# Patient Record
Sex: Male | Born: 1945 | Race: Black or African American | Hispanic: No | Smoking: Former smoker
Health system: Southern US, Community
[De-identification: ages and names within clinical notes are randomized; demographics above are authoritative.]

## PROBLEM LIST (undated history)

## (undated) DIAGNOSIS — I639 Cerebral infarction, unspecified: Secondary | ICD-10-CM

## (undated) DIAGNOSIS — F0390 Unspecified dementia without behavioral disturbance: Secondary | ICD-10-CM

## (undated) DIAGNOSIS — I10 Essential (primary) hypertension: Secondary | ICD-10-CM

---

## 2005-03-09 ENCOUNTER — Emergency Department: Payer: Self-pay | Admitting: Emergency Medicine

## 2005-05-06 ENCOUNTER — Other Ambulatory Visit: Payer: Self-pay

## 2005-05-06 ENCOUNTER — Inpatient Hospital Stay: Payer: Self-pay | Admitting: Internal Medicine

## 2005-05-09 ENCOUNTER — Emergency Department: Payer: Self-pay | Admitting: General Practice

## 2005-05-17 ENCOUNTER — Ambulatory Visit: Payer: Self-pay | Admitting: *Deleted

## 2006-02-19 ENCOUNTER — Emergency Department: Payer: Self-pay | Admitting: Emergency Medicine

## 2006-08-03 ENCOUNTER — Ambulatory Visit: Payer: Self-pay | Admitting: Internal Medicine

## 2006-08-03 ENCOUNTER — Other Ambulatory Visit: Payer: Self-pay

## 2007-05-21 ENCOUNTER — Other Ambulatory Visit: Payer: Self-pay

## 2007-05-21 ENCOUNTER — Emergency Department: Payer: Self-pay | Admitting: Emergency Medicine

## 2007-07-24 ENCOUNTER — Ambulatory Visit: Payer: Self-pay | Admitting: Gastroenterology

## 2007-07-26 ENCOUNTER — Ambulatory Visit: Payer: Self-pay | Admitting: Gastroenterology

## 2008-08-14 ENCOUNTER — Ambulatory Visit: Payer: Self-pay | Admitting: Internal Medicine

## 2010-03-16 ENCOUNTER — Inpatient Hospital Stay: Payer: Self-pay | Admitting: Internal Medicine

## 2011-04-24 ENCOUNTER — Emergency Department: Payer: Self-pay | Admitting: Emergency Medicine

## 2011-04-24 LAB — CBC
HCT: 44.5 % (ref 40.0–52.0)
HGB: 15.3 g/dL (ref 13.0–18.0)
MCH: 28.1 pg (ref 26.0–34.0)
MCHC: 34.4 g/dL (ref 32.0–36.0)
MCV: 82 fL (ref 80–100)
RBC: 5.44 10*6/uL (ref 4.40–5.90)
RDW: 13.4 % (ref 11.5–14.5)
WBC: 7.5 10*3/uL (ref 3.8–10.6)

## 2011-04-24 LAB — PROTIME-INR: Prothrombin Time: 15 secs — ABNORMAL HIGH (ref 11.5–14.7)

## 2011-04-24 LAB — TROPONIN I: Troponin-I: 0.11 ng/mL — ABNORMAL HIGH

## 2011-04-24 LAB — COMPREHENSIVE METABOLIC PANEL
Albumin: 4 g/dL (ref 3.4–5.0)
Alkaline Phosphatase: 72 U/L (ref 50–136)
Bilirubin,Total: 0.4 mg/dL (ref 0.2–1.0)
Calcium, Total: 9.6 mg/dL (ref 8.5–10.1)
Glucose: 95 mg/dL (ref 65–99)
Osmolality: 281 (ref 275–301)
SGOT(AST): 44 U/L — ABNORMAL HIGH (ref 15–37)
Sodium: 140 mmol/L (ref 136–145)

## 2011-04-26 ENCOUNTER — Inpatient Hospital Stay: Payer: Self-pay | Admitting: Internal Medicine

## 2011-04-26 LAB — CBC
HCT: 45.2 % (ref 40.0–52.0)
HGB: 14.8 g/dL (ref 13.0–18.0)
MCH: 27.5 pg (ref 26.0–34.0)
MCHC: 32.7 g/dL (ref 32.0–36.0)
Platelet: 236 10*3/uL (ref 150–440)
RDW: 14.6 % — ABNORMAL HIGH (ref 11.5–14.5)

## 2011-04-26 LAB — COMPREHENSIVE METABOLIC PANEL
Albumin: 4 g/dL (ref 3.4–5.0)
Alkaline Phosphatase: 77 U/L (ref 50–136)
Bilirubin,Total: 0.4 mg/dL (ref 0.2–1.0)
Chloride: 103 mmol/L (ref 98–107)
Creatinine: 1.26 mg/dL (ref 0.60–1.30)
Glucose: 100 mg/dL — ABNORMAL HIGH (ref 65–99)
Potassium: 3.1 mmol/L — ABNORMAL LOW (ref 3.5–5.1)
SGOT(AST): 43 U/L — ABNORMAL HIGH (ref 15–37)
SGPT (ALT): 15 U/L
Sodium: 144 mmol/L (ref 136–145)
Total Protein: 8.7 g/dL — ABNORMAL HIGH (ref 6.4–8.2)

## 2011-04-26 LAB — URINALYSIS, COMPLETE
Bilirubin,UR: NEGATIVE
Glucose,UR: NEGATIVE mg/dL (ref 0–75)
Ketone: NEGATIVE
Leukocyte Esterase: NEGATIVE
Ph: 6 (ref 4.5–8.0)
RBC,UR: NONE SEEN /HPF (ref 0–5)
Squamous Epithelial: <1
WBC UR: <1 /HPF (ref 0–5)

## 2011-04-26 LAB — CK TOTAL AND CKMB (NOT AT ARMC)
CK, Total: 47 U/L (ref 35–232)
CK-MB: <0.5 ng/mL — ABNORMAL LOW (ref 0.5–3.6)
CK-MB: <0.5 ng/mL — ABNORMAL LOW (ref 0.5–3.6)

## 2011-04-26 LAB — TROPONIN I: Troponin-I: 0.07 ng/mL — ABNORMAL HIGH

## 2011-04-29 LAB — LIPID PANEL
Cholesterol: 147 mg/dL (ref 0–200)
HDL Cholesterol: 52 mg/dL (ref 40–60)
Ldl Cholesterol, Calc: 79 mg/dL (ref 0–100)
Triglycerides: 81 mg/dL (ref 0–200)
VLDL Cholesterol, Calc: 16 mg/dL (ref 5–40)

## 2011-04-29 LAB — CBC WITH DIFFERENTIAL/PLATELET
Basophil %: 0.4 %
Eosinophil #: 0.2 10*3/uL (ref 0.0–0.7)
Eosinophil %: 2.9 %
HCT: 39 % — ABNORMAL LOW (ref 40.0–52.0)
HGB: 12.9 g/dL — ABNORMAL LOW (ref 13.0–18.0)
Lymphocyte #: 2.3 10*3/uL (ref 1.0–3.6)
Lymphocyte %: 35.2 %
MCH: 27.5 pg (ref 26.0–34.0)
MCHC: 33.1 g/dL (ref 32.0–36.0)
MCV: 83 fL (ref 80–100)
Monocyte #: 0.6 10*3/uL (ref 0.0–0.7)
Neutrophil #: 3.5 10*3/uL (ref 1.4–6.5)
Neutrophil %: 52.7 %
RBC: 4.69 10*6/uL (ref 4.40–5.90)

## 2011-04-29 LAB — BASIC METABOLIC PANEL
Calcium, Total: 9.5 mg/dL (ref 8.5–10.1)
Co2: 26 mmol/L (ref 21–32)
Creatinine: 1.3 mg/dL (ref 0.60–1.30)
EGFR (African American): >60
Potassium: 3.8 mmol/L (ref 3.5–5.1)
Sodium: 139 mmol/L (ref 136–145)

## 2011-04-30 ENCOUNTER — Ambulatory Visit: Payer: Self-pay | Admitting: Internal Medicine

## 2011-05-02 LAB — WOUND CULTURE

## 2011-05-03 LAB — CBC WITH DIFFERENTIAL/PLATELET
Basophil #: 0.1 10*3/uL (ref 0.0–0.1)
Basophil %: 0.9 %
Eosinophil #: 0.4 10*3/uL (ref 0.0–0.7)
HGB: 12.4 g/dL — ABNORMAL LOW (ref 13.0–18.0)
Lymphocyte %: 30.2 %
MCH: 27.5 pg (ref 26.0–34.0)
Monocyte #: 0.5 10*3/uL (ref 0.0–0.7)
Monocyte %: 8.2 %
Platelet: 249 10*3/uL (ref 150–440)
RDW: 13.9 % (ref 11.5–14.5)
WBC: 6.5 10*3/uL (ref 3.8–10.6)

## 2011-05-03 LAB — BASIC METABOLIC PANEL
Anion Gap: 11 (ref 7–16)
BUN: 23 mg/dL — ABNORMAL HIGH (ref 7–18)
Chloride: 105 mmol/L (ref 98–107)
Creatinine: 1.05 mg/dL (ref 0.60–1.30)
EGFR (Non-African Amer.): >60
Glucose: 86 mg/dL (ref 65–99)
Osmolality: 288 (ref 275–301)
Potassium: 4 mmol/L (ref 3.5–5.1)

## 2011-05-03 LAB — APTT: Activated PTT: 139.4 secs — ABNORMAL HIGH (ref 23.6–35.9)

## 2011-05-04 LAB — APTT: Activated PTT: 110.7 secs — ABNORMAL HIGH (ref 23.6–35.9)

## 2011-05-05 LAB — APTT: Activated PTT: 102.3 secs — ABNORMAL HIGH (ref 23.6–35.9)

## 2011-05-05 LAB — PROTIME-INR: Prothrombin Time: 18.4 secs — ABNORMAL HIGH (ref 11.5–14.7)

## 2011-05-05 LAB — PLATELET COUNT: Platelet: 263 10*3/uL (ref 150–440)

## 2011-05-06 LAB — APTT: Activated PTT: 124.4 secs — ABNORMAL HIGH (ref 23.6–35.9)

## 2011-05-07 LAB — BASIC METABOLIC PANEL
Anion Gap: 8 (ref 7–16)
BUN: 17 mg/dL (ref 7–18)
Calcium, Total: 9 mg/dL (ref 8.5–10.1)
Co2: 29 mmol/L (ref 21–32)
EGFR (African American): >60
EGFR (Non-African Amer.): >60
Glucose: 97 mg/dL (ref 65–99)
Osmolality: 286 (ref 275–301)
Sodium: 143 mmol/L (ref 136–145)

## 2011-05-07 LAB — PROTIME-INR
INR: 2.2
Prothrombin Time: 24.4 secs — ABNORMAL HIGH (ref 11.5–14.7)

## 2011-05-08 LAB — PROTIME-INR
INR: 2.4
Prothrombin Time: 26.7 secs — ABNORMAL HIGH (ref 11.5–14.7)

## 2011-05-09 LAB — PROTIME-INR
INR: 2.7
Prothrombin Time: 28.7 secs — ABNORMAL HIGH (ref 11.5–14.7)

## 2011-05-11 LAB — PROTIME-INR: INR: 3

## 2011-05-12 LAB — CBC WITH DIFFERENTIAL/PLATELET
Basophil #: 0 10*3/uL (ref 0.0–0.1)
Basophil %: 0.8 %
Eosinophil %: 4.7 %
HCT: 39 % — ABNORMAL LOW (ref 40.0–52.0)
Lymphocyte #: 1.8 10*3/uL (ref 1.0–3.6)
Lymphocyte %: 33.7 %
MCV: 83 fL (ref 80–100)
Monocyte %: 6.5 %
Neutrophil #: 3 10*3/uL (ref 1.4–6.5)
RDW: 14.4 % (ref 11.5–14.5)
WBC: 5.5 10*3/uL (ref 3.8–10.6)

## 2011-05-12 LAB — BASIC METABOLIC PANEL
Anion Gap: 10 (ref 7–16)
BUN: 20 mg/dL — ABNORMAL HIGH (ref 7–18)
Chloride: 106 mmol/L (ref 98–107)
Creatinine: 0.82 mg/dL (ref 0.60–1.30)
Osmolality: 285 (ref 275–301)
Potassium: 4.4 mmol/L (ref 3.5–5.1)

## 2011-05-12 LAB — PROTIME-INR
INR: 3.5
Prothrombin Time: 35 secs — ABNORMAL HIGH (ref 11.5–14.7)

## 2011-05-13 LAB — PROTIME-INR
INR: 3.3
Prothrombin Time: 33.4 s — ABNORMAL HIGH

## 2011-05-28 ENCOUNTER — Ambulatory Visit: Payer: Self-pay | Admitting: Internal Medicine

## 2014-07-21 NOTE — Consult Note (Signed)
PATIENT NAME:  Willie George, Willie George MR#:  [id number] DATE OF BIRTH:  Oct 16, 1945  DATE OF CONSULTATION:  04/26/2011  REFERRING PHYSICIAN:  Dr. Allena KatzPatel CONSULTING PHYSICIAN:  Cammy CopaPaul H. Alessander Sikorski, MD  REASON FOR CONSULTATION: Acute vertigo.   HISTORY OF PRESENT ILLNESS: The patient a 69 year old African American male who complains of getting up out of bed and was acutely dizzy starting Saturday morning. He said he fell. He has continued to remain unsteady of his gait and dizzy and not able to walk. He feels like the room is spinning. He has not had any pain his ears, no drainage, no signs of infection from his ears, and no upper respiratory infection. He has not had any change in his hearing at all or feeling any fullness in his ears. He denies having similar dizzy symptoms in the past, although there is a note in the chart that he has had similar dizziness in December 2011 with a MRI of the brain and carotid duplex, all being negative. He has been taking some meclizine on a regular basis because of the dizziness, using it three times daily currently.  PAST MEDICAL HISTORY: History is significant for coronary artery disease. He had enzymes drawn make sure there is no cardiac problems. He has had hypertension that has been mildly elevated. He has had high cholesterol, on medication.   CURRENT MEDICATIONS: Pravastatin, chlorthalidone, clonidine, aspirin, and meclizine.   PHYSICAL EXAMINATION:   GENERAL: The patient is awake and alert. He has some difficulty getting some words out and it is not a stuttering but more a stammering. He does eventually get it out. His mentation appears to be intact. He seems to be a fairly good historian. His brother is with him who seems to know his history and help out.  EYES: No nystagmus currently.  EARS: His ear canals are clear. TMs are intact, benign. The middle ear space looks normal on both sides.   NOSE: Open and clear.   MOUTH: Oropharynx shows him to have dentures in  place. Posterior pharynx is clear.   NECK: Neck is negative for any nodes or masses. No tenderness here. Neck is supple and moves well.   IMPRESSION: He has had an acute episode of vertigo again. He is scheduled for a MRI scan to make sure there are no significant changes here. CT scan done acutely two days ago showed no evidence of acute intracranial bleed or stroke. The patient's history is consistent with an acute labyrinthitis or even potentially Meniere's disease if he has recurring flare-ups of this. ENG needs to be done, and once the patient is discharged from the hospital he has to be off meclizine for three days prior to that. Right now he is eating and feeling better, and I think he can have the meclizine put on a p.r.n. basis rather than on a scheduled basis. He will need to be off it then when he is at home so we can schedule the balance test to be done. We will get that arranged once he leaves to follow up with us and see if there is significant weakness on one side.  ____________________________ Cammy CopaPaul H. Daralyn Bert, MD phj:slb D: 04/26/2011 19:00:00 ET T: 04/27/2011 09:04:35 ET JOB#: [id number]  cc: Cammy CopaPaul H. Nayanna Seaborn, MD, <Dictator> Cammy CopaPAUL H Gerber Penza MD ELECTRONICALLY SIGNED 04/29/2011 8:27

## 2014-07-21 NOTE — Discharge Summary (Signed)
PATIENT NAME:  Willie George, Willie George MR#:  [id number] DATE OF BIRTH:  November 22, 1945  DATE OF ADMISSION:  04/26/2011 DATE OF DISCHARGE:  05/13/2011  This is the final discharge summary in addition to the interim discharge summary dictated by Dr. Camillo FlamingKamran Lateef on 05/11/2011. My summary will include hospital stay 02/13 and 05/13/2011.    HOSPITAL COURSE: [name] continued to improve as far as his left cerebellar and pontine infarct. His INR was therapeutic on warfarin. His dizziness had improved. He will continue physical therapy. He will be going to rehab [facility]. Hospital stay was prolonged, however, remained stable.   For detailed discharge summary per Dr. Garnett FarmLateef's interim discharge summary.   FINAL MEDICATION LIST:  1. Clonidine 0.2 mg b.i.d.  2. Pravachol 20 mg at bedtime.  3. Tylenol 650 mg p.o. every four hours p.r.n.  4. Zofran 4 mg every six hours p.r.n.  5. Valium 5 mg p.o. at bedtime.   6. Meclizine 25 mg b.i.d. p.r.n. for dizziness.  7. Amlodipine 5 mg daily.  8. Warfarin 4 mg q.11:00 a.m.  9. Celexa 20 mg daily.  10. Metoprolol 25 mg b.i.d.   INSTRUCTIONS:  1. Physical therapy.  2. Speech therapy to follow.  3. Aspiration precautions.  4. Check PT/INR on 05/15/2011 and adjust Coumadin dose accordingly.   CODE STATUS: Patient is a NO CODE, DO NOT RESUSCITATE.     TIME SPENT: 30 minutes.   ____________________________ Wylie HailSona A. Allena KatzPatel, MD sap:cms D: 05/13/2011 13:58:27 ET T: 05/13/2011 14:19:05 ET JOB#: [id number]  cc: Hawk Mones A. Allena KatzPatel, MD, <Dictator> Willow OraSONA A Helyne Genther MD ELECTRONICALLY SIGNED 05/23/2011 11:35

## 2014-07-21 NOTE — Consult Note (Signed)
PATIENT NAME:  Willie George, [name]:  [id number] DATE OF BIRTH:  1945/10/17  DATE OF CONSULTATION:  04/26/2011  REFERRING PHYSICIAN:  Dr. Allena KatzPatel  CONSULTING PHYSICIAN:  Rose PhiPeter R. Kemper Durielarke, MD  HISTORY: Mr. Willie George is a 69 year old left-handed widowed African American patient of [facility], retired Education officer, environmentalmill worker at the [employer] with history of hypertension and remote tobacco and ethanol abuse. He was admitted early 04/26/2011 and is referred for evaluation of transient ischemic attack. History comes from the patient and from his hospital records.   The patient came to the Emergency Room shortly after midnight on 04/26/2011 secondary to continued problems with poor balance, dizziness, unsteadiness on his feet. He had been seen in the Emergency Room on Saturday, 04/24/2011, and was prescribed meclizine for dizziness beginning the same day. Reports today being unsteady with need to cruise on getting out of bed the morning of 04/24/2011, and then falling loss of balance when he tried to put on his pants. He denies recent cold or flu or sinus symptoms. He reports also some problem with right side right eye vision beginning the morning of 01/26. Denies any prior similar problems. Brain CT scan was benign; he is scheduled for brain MRI scan to be performed morning of the 29th. He was seen by Dr. Vernie MurdersPaul Juengel of ear, nose, and throat with normal examination and question of acute labyrinthitis.   PHYSICAL EXAMINATION: The patient is a well-developed, well-nourished African American gentleman who was pleasant and cooperative in no apparent distress, examined lying semisupine. He was normocephalic without evidence of trauma. His neck was supple and his ears were clear. He had very mild hesitation of speech with normal expression and normal mental status overall, although cognitive testing was not performed in detail. He was alert and oriented and was lucid and a good historian with normal affect. Cranial nerve examination was  notable for lateral gaze-paretic nystagmus with looking to his right side. Visual fields were full to finger count for each eye. There is no facial weakness and hearing was within normal limits to finger rubbing bilaterally. On motor examination of the extremities, there was normal tone and muscle bulk throughout with full power in the arms and legs proximally and distally. Extremity sensation was symmetric and normal. On coordination examination, there was mild left finger-to-nose dystaxia with good bilateral hand and foot tapping. His gait was not tested. Reflexes were symmetric and rated 1+ throughout.   IMPRESSION: His clinical picture appears most consistent with recent small posterior circulation nonhemorrhagic stroke of the brainstem, I suspect of the left cerebellum or pons.   RECOMMENDATIONS:  1. I agree with his present work-up and treatment in hospital including imaging and laboratory studies.  2. Doppler of the carotid and vertebral arteries.  3. One small aspirin per day.  4. Physical therapy, occupational therapy referrals.   I appreciate being asked to see this pleasant and interesting gentleman. I will plan to follow up on the result of his brain MRI scan tomorrow.   ____________________________ Rose PhiPeter R. Kemper Durielarke, MD prc:cms D: 04/27/2011 11:08:00 ET T: 04/27/2011 11:38:34 ET JOB#: [id number]  cc: Rose PhiPeter R. Kemper Durielarke, MD, <Dictator> Gaspar GarbePETER R Kamarion Zagami MD ELECTRONICALLY SIGNED 04/30/2011 14:41

## 2014-07-21 NOTE — Consult Note (Signed)
Willie George [name] Willie George George, Willie George Willie George George MR#:  [id number] DATE OF BIRTH:  [birth year]  DATE OF CONSULTATION:  04/27/2011  CONSULTING PHYSICIAN:  Claude Manges, MD  REASON FOR CONSULTATION: Buttock abscess.   HISTORY OF PRESENT ILLNESS: Willie George Willie George George is a 69 year old black male who was admitted yesterday with dizziness and a headache and found to have a small left cerebellar CVA. While his other medical problems are being worked up, he was noted to have some drainage from his buttock and, therefore, I was consulted.   PAST MEDICAL HISTORY:  1. Hypertension, previously out of control.  2. Tobacco use.  3. Anxiety.  4. Hypokalemia.  5. Memory deficit.   ALLERGIES: No known drug allergies.   MEDICATIONS ON ADMISSION: Chlorthalidone, clonidine, meclizine, metoprolol and pravastatin.   SOCIAL HISTORY: Willie George Willie George George recently quit smoking and denies alcohol use and illicit drug use.   FAMILY HISTORY: Noncontributory.   REVIEW OF SYSTEMS: Review of systems is negative for 10 systems except as mentioned in Willie George history of present illness. Specifically, Willie George Willie George George has memory deficit and cannot remember whether he had previous surgery or not. He does know that he is hospitalized for "another problem" than Willie George drainage from near his buttocks.   PHYSICAL EXAMINATION:  GENERAL: Examination reveals a comfortable sleeping elderly black gentleman who arouses easily and is quite cooperative. Height is 5 feet 5 inches, weight 139 pounds, BMI 23.2.   VITAL SIGNS: Temperature 97.4 (he has been afebrile throughout this admission), pulse 80, respirations 18, blood pressure 130/84.   HEENT: Pupils are equally round and reactive to light. Extraocular movements are intact. Sclerae are anicteric. Oropharynx is clear.   NECK: Supple with no lymphadenopathy or jugular venous distention. Willie George trachea is in Willie George midline.   HEART: Regular rate and rhythm with no murmurs or rubs.   LUNGS: Clear to auscultation with normal  respiratory effort bilaterally.   ABDOMEN: Soft, nontender, nondistended with no palpable hepatosplenomegaly or other masses.   BACK/ANAL: Exam reveals a previous scar just to Willie George left at Willie George natal cleft with an area of induration and drainage consistent with a recurrent pilonidal sinus tract that is currently infected and has a little bit of surrounding cellulitis.   EXTREMITIES: No edema with normal capillary refill bilaterally.   NEUROLOGIC: Cranial nerves II through XII, motor and sensation grossly intact.   PSYCHIATRIC: Arousable, but with clear memory deficit regarding his past medical history and even parts of his current hospitalization.   LABORATORY, DIAGNOSTIC AND RADIOLOGICAL DATA:  Electrolytes are normal with Willie George exception of a potassium of 3.1.  Hepatic profile is normal.  White blood cell count 9.8, hematocrit 45%, PT 15, INR 1.1. A wound culture of Willie George pilonidal cyst/sinus tract showed a few gram-positive cocci in pairs with rare gram-negative rods and no white blood cells.  Urinalysis was clear, and an ultrasound of Willie George soft tissue surrounding Willie George pilonidal area showed an ill-defined hypoechoic collection that measured 4 x 1 x 2 cm.   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   PLAN: I will place Willie George Willie George George on oral antibiotics that he can go home on if he is discharged from Willie George standpoint of his recent cerebrovascular accident. I will be happy to follow him up in my office in a couple of weeks.   ____________________________ Claude Manges, MD wfm:cbb D: 04/27/2011 17:23:00 ET T: 04/27/2011 18:36:45 ET JOB#: [id number]  cc: Claude Manges, MD, <  Dictator> Claude MangesWILLIAM F Leylany Nored MD ELECTRONICALLY SIGNED 04/29/2011 12:26

## 2014-07-21 NOTE — H&P (Signed)
PATIENT NAME:  Willie George, Willie George MR#:  [id number] DATE OF BIRTH:  June 05, 1945  DATE OF ADMISSION:  04/26/2011  PRIMARY CARE PHYSICIAN:  Willie George  CHIEF COMPLAINT: Dizziness, headache.    HISTORY OF PRESENT ILLNESS: The patient is a 69 year old male who presents with chief complaint of dizziness. Symptoms started yesterday. The patient felt that the room was spinning around him. He had a fall last night. He denies any significant injuries. Blood pressure has been elevated at home. He was recently hospitalized for hypertensive urgency on 03/17/2011 and was treated with IV labetalol. He has been maintained on clonidine for high blood pressure. In the Emergency Room today he underwent CT of the brain, which is negative. He was evaluated in the Emergency Room yesterday 04/24/2011 and his troponin was noted to be 0.1. Troponin today is 0.08. Troponin on 04/24/2011 was 0.11. He was evaluated in the Emergency Room on 03/18/2011 for chest pain, medical management was recommended with aspirin and beta blockers.   PAST MEDICAL HISTORY:  1. Hypertensive urgency. 2. Tobacco abuse. 3. Anxiety. 4. Hypokalemia.   ALLERGIES: No known drug allergies.   CURRENT MEDICATIONS:  1. Chlorthalidone 25 mg p.o. daily.  2. Clonidine 0.2 mg p.o. b.i.d.  3. Meclizine 25 mg p.o. t.i.d.  4. Metoprolol 75 mg p.o. b.i.d.  5. Pravastatin 20 mg p.o. daily.   SOCIAL HISTORY: He reports history of tobacco abuse. He quit smoking recently. He denies alcohol abuse or drug abuse.   FAMILY HISTORY: Denies coronary artery disease, diabetes, or stroke.     REVIEW OF SYSTEMS: CONSTITUTIONAL: The patient denies any fevers, chills, or night sweats.  HEENT: The patient denies any hearing loss, dysphagia, visual problems, sore throat.  CARDIOVASCULAR: The patient denies any chest pain, orthopnea, or paroxysmal nocturnal dyspnea. RESPIRATORY: The patient denies any cough, wheezing, or hemoptysis. GI: The patient denies any nausea,  vomiting, abdominal pain, hematemesis, hematochezia, or melena. GU: The patient denies any hematuria, dysuria, or frequency.  NEURO:  The patient denies any headache, focal weakness, or seizures. SKIN: The patient denies any lesions or rash. ENDOCRINE: The patient denies polyuria, polyphagia, or polydipsia. MUSCULOSKELETAL: The patient denies any arthralgias, myalgias, joint swelling, or tenderness. HEMATOLOGIC: The patient denies any easy bleeding or bruises.   PHYSICAL EXAMINATION:  VITAL SIGNS: Temperature 98, heart rate 67, respiratory rate 18, blood pressure 157/97. O2 sats 100%.   HEENT: Atraumatic, normocephalic. Pupils equal, round, reactive to light and accommodation.  Extraocular movements intact.  Sclerae anicteric. Mucous membranes are moist.   NECK: Supple. No organomegaly.   CARDIOVASCULAR: S1, S2. Regular rate, rhythm. No gallops. No thrills. No murmurs.   LUNGS: Clear to auscultation. No rales, no rhonchi, no wheezes, no bronchial breath sounds.   GI: Abdomen is soft, nontender, nondistended. Normal bowel sounds. No hepatosplenomegaly.   GU: There is no hematuria or masses noted.   SKIN: No lesions, no rash.  ENDOCRINE:  No masses, no thyromegaly.  LYMPH:  No lymphadenopathy or nodes palpable.   NEUROLOGICAL: Cranial nerves II-XII grossly intact. Motor strength is 5/5 bilateral upper and lower extremities. Sensation is within normal limits. No focal neurological deficit noted on examination.   MUSCULOSKELETAL: No arthritis, joint effusion, or swelling.   EXTREMITIES: No cyanosis, no clubbing, no edema. 2+ pedal pulses are noted bilaterally.  ELECTROCARDIOGRAM: Sinus rhythm with premature atrial complexes, 68 beats per minute, left ventricular hypertrophy. CT of the brain is negative.   ASSESSMENT AND PLAN: 1. The patient is a 69 year old male who  presents with chief complaint of dizziness and elevated troponin. We will admit to telemetry. Start aspirin. Continue  metoprolol. Check serial cardiac enzymes, troponin, and echo. Cardiology consultation.  2. Transient ischemic attack.  Check MRI, MRA of brain. Neurology consultation.  3. Hypertension. Continue clonidine.  4. Hyperlipidemia. Continue pravastatin.  5. Hypokalemia. Replace potassium, recheck in the morning.  6. Elevated AST. Monitor liver functions.   ____________________________ Willie AstJignesh S. Saraann Enneking, MD jsp:bjt D: 04/26/2011 03:14:29 ET T: 04/26/2011 07:36:20 ET JOB#: [id number]  cc: Willie AstJignesh S. Draco Malczewski, MD, <Dictator> Willie George Willie AstJIGNESH S Modestine Scherzinger MD ELECTRONICALLY SIGNED 04/26/2011 21:47

## 2014-07-21 NOTE — H&P (Signed)
PATIENT NAME:  Willie George, Willie George MR#:  [id number] DATE OF BIRTH:  06/28/1945  DATE OF ADMISSION:  04/26/2011  PRIMARY CARE PHYSICIAN:  [facility]  CHIEF COMPLAINT: Dizziness, headache.    HISTORY OF PRESENT ILLNESS: The patient is a 69-year-old male who presents with chief complaint of dizziness. Symptoms started yesterday. The patient felt that the room was spinning around him. He had a fall last night. He denies any significant injuries. Blood pressure has been elevated at home. He was recently hospitalized for hypertensive urgency on 03/17/2011 and was treated with IV labetalol. He has been maintained on clonidine for high blood pressure. In the Emergency Room today he underwent CT of the brain, which is negative. He was evaluated in the Emergency Room yesterday 04/24/2011 and his troponin was noted to be 0.1. Troponin today is 0.08. Troponin on 04/24/2011 was 0.11. He was evaluated in the Emergency Room on 03/18/2011 for chest pain, medical management was recommended with aspirin and beta blockers.   PAST MEDICAL HISTORY:  1. Hypertensive urgency. 2. Tobacco abuse. 3. Anxiety. 4. Hypokalemia.   ALLERGIES: No known drug allergies.   CURRENT MEDICATIONS:  1. Chlorthalidone 25 mg p.o. daily.  2. Clonidine 0.2 mg p.o. b.i.d.  3. Meclizine 25 mg p.o. t.i.d.  4. Metoprolol 75 mg p.o. b.i.d.  5. Pravastatin 20 mg p.o. daily.   SOCIAL HISTORY: He reports history of tobacco abuse. He quit smoking recently. He denies alcohol abuse or drug abuse.   FAMILY HISTORY: Denies coronary artery disease, diabetes, or stroke.     REVIEW OF SYSTEMS: CONSTITUTIONAL: The patient denies any fevers, chills, or night sweats.  HEENT: The patient denies any hearing loss, dysphagia, visual problems, sore throat.  CARDIOVASCULAR: The patient denies any chest pain, orthopnea, or paroxysmal nocturnal dyspnea. RESPIRATORY: The patient denies any cough, wheezing, or hemoptysis. GI: The patient denies any nausea,  vomiting, abdominal pain, hematemesis, hematochezia, or melena. GU: The patient denies any hematuria, dysuria, or frequency.  NEURO:  The patient denies any headache, focal weakness, or seizures. SKIN: The patient denies any lesions or rash. ENDOCRINE: The patient denies polyuria, polyphagia, or polydipsia. MUSCULOSKELETAL: The patient denies any arthralgias, myalgias, joint swelling, or tenderness. HEMATOLOGIC: The patient denies any easy bleeding or bruises.   PHYSICAL EXAMINATION:  VITAL SIGNS: Temperature 98, heart rate 67, respiratory rate 18, blood pressure 157/97. O2 sats 100%.   HEENT: Atraumatic, normocephalic. Pupils equal, round, reactive to light and accommodation.  Extraocular movements intact.  Sclerae anicteric. Mucous membranes are moist.   NECK: Supple. No organomegaly.   CARDIOVASCULAR: S1, S2. Regular rate, rhythm. No gallops. No thrills. No murmurs.   LUNGS: Clear to auscultation. No rales, no rhonchi, no wheezes, no bronchial breath sounds.   GI: Abdomen is soft, nontender, nondistended. Normal bowel sounds. No hepatosplenomegaly.   GU: There is no hematuria or masses noted.   SKIN: No lesions, no rash.  ENDOCRINE:  No masses, no thyromegaly.  LYMPH:  No lymphadenopathy or nodes palpable.   NEUROLOGICAL: Cranial nerves II-XII grossly intact. Motor strength is 5/5 bilateral upper and lower extremities. Sensation is within normal limits. No focal neurological deficit noted on examination.   MUSCULOSKELETAL: No arthritis, joint effusion, or swelling.   EXTREMITIES: No cyanosis, no clubbing, no edema. 2+ pedal pulses are noted bilaterally.  ELECTROCARDIOGRAM: Sinus rhythm with premature atrial complexes, 68 beats per minute, left ventricular hypertrophy. CT of the brain is negative.   ASSESSMENT AND PLAN: 1. The patient is a 69-year-old male who   presents with chief complaint of dizziness and elevated troponin. We will admit to telemetry. Start aspirin. Continue  metoprolol. Check serial cardiac enzymes, troponin, and echo. Cardiology consultation.  2. Transient ischemic attack.  Check MRI, MRA of brain. Neurology consultation.  3. Hypertension. Continue clonidine.  4. Hyperlipidemia. Continue pravastatin.  5. Hypokalemia. Replace potassium, recheck in the morning.  6. Elevated AST. Monitor liver functions.   ____________________________ Maresa Morash S. Nelline Lio, MD jsp:bjt D: 04/26/2011 03:14:29 ET T: 04/26/2011 07:36:20 ET JOB#: [id number]  cc: Verlene Glantz S. Advay Volante, MD, <Dictator> [facility] Domonick Sittner S Tajh Livsey MD ELECTRONICALLY SIGNED 04/26/2011 21:47 

## 2014-07-21 NOTE — Consult Note (Signed)
Brief Consult Note: Diagnosis: recurrent pilonidal cyst/sinus with cellulitis.   Patient was seen by consultant.   Consult note dictated.   Recommend further assessment or treatment.   Orders entered.   Comments: appears to be spontaneously draining adequately will treat cellulitis with Augmentin 875/125 x 10 days, which he can go home on f/u with me in 2 - 3 weeks.  Electronic Signatures: Claude MangesMarterre, Ever Halberg F (MD)  (Signed 29-Jan-13 17:28)  Authored: Brief Consult Note   Last Updated: 29-Jan-13 17:28 by Claude MangesMarterre, Jamason Peckham F (MD)

## 2014-10-12 ENCOUNTER — Inpatient Hospital Stay
Admission: EM | Admit: 2014-10-12 | Discharge: 2014-10-17 | DRG: 389 | Disposition: A | Discharge status: Skilled Nursing Facility | Payer: Medicare Other | Attending: Specialist | Admitting: Specialist

## 2014-10-12 ENCOUNTER — Emergency Department: Payer: Medicare Other

## 2014-10-12 DIAGNOSIS — R140 Abdominal distension (gaseous): Secondary | ICD-10-CM | POA: Insufficient documentation

## 2014-10-12 DIAGNOSIS — E785 Hyperlipidemia, unspecified: Secondary | ICD-10-CM | POA: Diagnosis present

## 2014-10-12 DIAGNOSIS — K56609 Unspecified intestinal obstruction, unspecified as to partial versus complete obstruction: Secondary | ICD-10-CM | POA: Diagnosis present

## 2014-10-12 DIAGNOSIS — E860 Dehydration: Secondary | ICD-10-CM | POA: Diagnosis present

## 2014-10-12 DIAGNOSIS — I69351 Hemiplegia and hemiparesis following cerebral infarction affecting right dominant side: Secondary | ICD-10-CM

## 2014-10-12 DIAGNOSIS — K5669 Other intestinal obstruction: Secondary | ICD-10-CM | POA: Diagnosis not present

## 2014-10-12 DIAGNOSIS — I10 Essential (primary) hypertension: Secondary | ICD-10-CM | POA: Diagnosis present

## 2014-10-12 DIAGNOSIS — K920 Hematemesis: Secondary | ICD-10-CM | POA: Diagnosis present

## 2014-10-12 DIAGNOSIS — Z79899 Other long term (current) drug therapy: Secondary | ICD-10-CM

## 2014-10-12 DIAGNOSIS — Z87891 Personal history of nicotine dependence: Secondary | ICD-10-CM

## 2014-10-12 DIAGNOSIS — R000 Tachycardia, unspecified: Secondary | ICD-10-CM | POA: Diagnosis present

## 2014-10-12 DIAGNOSIS — F0390 Unspecified dementia without behavioral disturbance: Secondary | ICD-10-CM | POA: Diagnosis present

## 2014-10-12 DIAGNOSIS — Z66 Do not resuscitate: Secondary | ICD-10-CM | POA: Diagnosis present

## 2014-10-12 DIAGNOSIS — R109 Unspecified abdominal pain: Secondary | ICD-10-CM | POA: Insufficient documentation

## 2014-10-12 DIAGNOSIS — R569 Unspecified convulsions: Secondary | ICD-10-CM | POA: Diagnosis present

## 2014-10-12 DIAGNOSIS — R100 Acute abdomen: Secondary | ICD-10-CM | POA: Diagnosis not present

## 2014-10-12 DIAGNOSIS — K560 Paralytic ileus: Secondary | ICD-10-CM | POA: Diagnosis present

## 2014-10-12 DIAGNOSIS — K5660 Unspecified intestinal obstruction: Secondary | ICD-10-CM | POA: Diagnosis present

## 2014-10-12 HISTORY — DX: Unspecified dementia, unspecified severity, without behavioral disturbance, psychotic disturbance, mood disturbance, and anxiety: F03.90

## 2014-10-12 HISTORY — DX: Essential (primary) hypertension: I10

## 2014-10-12 HISTORY — DX: Cerebral infarction, unspecified: I63.9

## 2014-10-12 LAB — CBC WITH DIFFERENTIAL/PLATELET
BASOS ABS: 0 10*3/uL (ref 0–0.1)
Basophils Relative: 0 %
EOS PCT: 0 %
Eosinophils Absolute: 0 10*3/uL (ref 0–0.7)
HCT: 52.2 % — ABNORMAL HIGH (ref 40.0–52.0)
Hemoglobin: 17.4 g/dL (ref 13.0–18.0)
LYMPHS ABS: 0.9 10*3/uL — AB (ref 1.0–3.6)
LYMPHS PCT: 7 %
MCH: 27.4 pg (ref 26.0–34.0)
MCHC: 33.4 g/dL (ref 32.0–36.0)
MCV: 82.1 fL (ref 80.0–100.0)
MONO ABS: 0.6 10*3/uL (ref 0.2–1.0)
MONOS PCT: 5 %
NEUTROS PCT: 88 %
Neutro Abs: 11.4 10*3/uL — ABNORMAL HIGH (ref 1.4–6.5)
Platelets: 256 10*3/uL (ref 150–440)
RBC: 6.35 MIL/uL — AB (ref 4.40–5.90)
RDW: 14.6 % — ABNORMAL HIGH (ref 11.5–14.5)
WBC: 13 10*3/uL — AB (ref 3.8–10.6)

## 2014-10-12 LAB — COMPREHENSIVE METABOLIC PANEL
ALT: 44 U/L (ref 17–63)
ANION GAP: 13 (ref 5–15)
AST: 61 U/L — ABNORMAL HIGH (ref 15–41)
Albumin: 4.4 g/dL (ref 3.5–5.0)
Alkaline Phosphatase: 104 U/L (ref 38–126)
BILIRUBIN TOTAL: 0.5 mg/dL (ref 0.3–1.2)
BUN: 20 mg/dL (ref 6–20)
CHLORIDE: 105 mmol/L (ref 101–111)
CO2: 24 mmol/L (ref 22–32)
CREATININE: 0.91 mg/dL (ref 0.61–1.24)
Calcium: 9.7 mg/dL (ref 8.9–10.3)
GFR calc Af Amer: >60 mL/min (ref >60)
GFR calc non Af Amer: >60 mL/min (ref >60)
Glucose, Bld: 113 mg/dL — ABNORMAL HIGH (ref 65–99)
Potassium: 3.7 mmol/L (ref 3.5–5.1)
Sodium: 142 mmol/L (ref 135–145)
Total Protein: 8.5 g/dL — ABNORMAL HIGH (ref 6.5–8.1)

## 2014-10-12 LAB — TROPONIN I: Troponin I: 0.03 ng/mL (ref <0.031)

## 2014-10-12 LAB — ABO/RH: ABO/RH(D): O POS

## 2014-10-12 LAB — LIPASE, BLOOD: LIPASE: 13 U/L — AB (ref 22–51)

## 2014-10-12 LAB — TYPE AND SCREEN
ABO/RH(D): O POS
Antibody Screen: NEGATIVE

## 2014-10-12 LAB — PROTIME-INR
INR: 1.34
Prothrombin Time: 16.8 seconds — ABNORMAL HIGH (ref 11.4–15.0)

## 2014-10-12 MED ORDER — IOHEXOL 300 MG/ML  SOLN
100.0000 mL | Freq: Once | INTRAMUSCULAR | Status: AC | PRN
  Administered 2014-10-12: 100 mL via INTRAVENOUS

## 2014-10-12 MED ORDER — HYDRALAZINE HCL 20 MG/ML IJ SOLN
10.0000 mg | Freq: Once | INTRAMUSCULAR | Status: AC
  Administered 2014-10-12: 10 mg via INTRAVENOUS
  Filled 2014-10-12: qty 1

## 2014-10-12 MED ORDER — MORPHINE SULFATE 4 MG/ML IJ SOLN
4.0000 mg | Freq: Once | INTRAMUSCULAR | Status: AC
  Administered 2014-10-12: 4 mg via INTRAVENOUS
  Filled 2014-10-12: qty 1

## 2014-10-12 MED ORDER — SODIUM CHLORIDE 0.9 % IV SOLN
Freq: Once | INTRAVENOUS | Status: AC
  Administered 2014-10-12: 18:00:00 via INTRAVENOUS

## 2014-10-12 MED ORDER — MORPHINE SULFATE 2 MG/ML IJ SOLN
2.0000 mg | INTRAMUSCULAR | Status: DC | PRN
  Administered 2014-10-12 – 2014-10-14 (×3): 2 mg via INTRAVENOUS
  Filled 2014-10-12 (×3): qty 1

## 2014-10-12 MED ORDER — ACETAMINOPHEN 325 MG PO TABS: 650.0000 mg | ORAL_TABLET | Freq: Four times a day (QID) | ORAL | Status: DC | PRN

## 2014-10-12 MED ORDER — SODIUM CHLORIDE 0.9 % IV SOLN
INTRAVENOUS | Status: DC
  Administered 2014-10-12: 100 mL/h via INTRAVENOUS
  Administered 2014-10-13 – 2014-10-14 (×4): via INTRAVENOUS
  Administered 2014-10-14: 1 mL via INTRAVENOUS
  Administered 2014-10-15: 19:00:00 via INTRAVENOUS
  Administered 2014-10-15: 1 mL via INTRAVENOUS
  Administered 2014-10-16 – 2014-10-17 (×3): via INTRAVENOUS

## 2014-10-12 MED ORDER — PANTOPRAZOLE SODIUM 40 MG IV SOLR
40.0000 mg | Freq: Once | INTRAVENOUS | Status: AC
  Administered 2014-10-12: 40 mg via INTRAVENOUS
  Filled 2014-10-12: qty 40

## 2014-10-12 MED ORDER — ONDANSETRON HCL 4 MG/2ML IJ SOLN
4.0000 mg | Freq: Once | INTRAMUSCULAR | Status: AC
  Administered 2014-10-12: 4 mg via INTRAVENOUS
  Filled 2014-10-12: qty 2

## 2014-10-12 MED ORDER — ONDANSETRON HCL 4 MG PO TABS: 4.0000 mg | ORAL_TABLET | Freq: Four times a day (QID) | ORAL | Status: DC | PRN

## 2014-10-12 MED ORDER — ACETAMINOPHEN 650 MG RE SUPP: 650.0000 mg | Freq: Four times a day (QID) | RECTAL | Status: DC | PRN

## 2014-10-12 MED ORDER — SODIUM CHLORIDE 0.9 % IJ SOLN
3.0000 mL | Freq: Two times a day (BID) | INTRAMUSCULAR | Status: DC
  Administered 2014-10-12 – 2014-10-16 (×3): 3 mL via INTRAVENOUS

## 2014-10-12 MED ORDER — METOPROLOL TARTRATE 1 MG/ML IV SOLN
5.0000 mg | Freq: Four times a day (QID) | INTRAVENOUS | Status: DC
  Administered 2014-10-12 – 2014-10-15 (×10): 5 mg via INTRAVENOUS
  Filled 2014-10-12 (×10): qty 5

## 2014-10-12 MED ORDER — ONDANSETRON HCL 4 MG/2ML IJ SOLN
4.0000 mg | Freq: Four times a day (QID) | INTRAMUSCULAR | Status: DC | PRN
  Administered 2014-10-12 – 2014-10-14 (×2): 4 mg via INTRAVENOUS
  Filled 2014-10-12 (×2): qty 2

## 2014-10-12 NOTE — ED Notes (Signed)
Pt here via ems from [facility], pt had an episode of coffee ground emesis and was observed by staff with vital sign parameters given by the facilities dr. When the pt's heart rate got to 104 they called 911. Pt reports that he is having mid abd pain and states that it is pretty bad, pt denies hx of aneurysm or any other abd issues

## 2014-10-12 NOTE — H&P (Signed)
[facility] -  at [facility]   PATIENT NAME: Willie George    MR#:  [id number]  DATE OF BIRTH:  06/16/1945   DATE OF ADMISSION:  10/12/2014  PRIMARY CARE PHYSICIAN: eason  REQUESTING/REFERRING PHYSICIAN: Mayford Knife  CHIEF COMPLAINT:   Chief Complaint  Patient presents with  . Hematemesis    HISTORY OF PRESENT ILLNESS:  Carla Whilden  is a 69 y.o. male with a known history of CVA with residual right-sided weakness, presenting with nausea, vomiting was described as coffee-ground emesis. Given the baseline mental status history is somewhat difficult to obtain, history obtained from patient as well as emergency department staff. Patient states is using his usual state of health until approximately 2 days ago where he is describing intermittent abdominal pain described as epigastric in location, "pain" in quality, 6-7/10 intensity, worse with by mouth intake. However today after eating is followed by emesis believed to be coffee ground in appearance.  PAST MEDICAL HISTORY:   Past Medical History  Diagnosis Date  . Hypertension   . Stroke     PAST SURGICAL HISTORY:  History reviewed. No pertinent past surgical history.  SOCIAL HISTORY:   History  Substance Use Topics  . Smoking status: Former Games developer  . Smokeless tobacco: Not on file  . Alcohol Use: No    FAMILY HISTORY:   Family History  Problem Relation Age of Onset  . Heart failure Neg Hx     DRUG ALLERGIES:  No Known Allergies  REVIEW OF SYSTEMS:  REVIEW OF SYSTEMS:  CONSTITUTIONAL: Denies fevers, chills, fatigue, weakness.  EYES: Denies blurred vision, double vision, or eye pain.  EARS, NOSE, THROAT: Denies tinnitus, ear pain, hearing loss.  RESPIRATORY: denies cough, shortness of breath, wheezing  CARDIOVASCULAR: Denies chest pain, palpitations, edema.  GASTROINTESTINAL: Positive nausea, vomiting, abdominal pain.  GENITOURINARY: Denies dysuria, hematuria.  ENDOCRINE: Denies  nocturia or thyroid problems. HEMATOLOGIC AND LYMPHATIC: Denies easy bruising or bleeding.  SKIN: Denies rash or lesions.  MUSCULOSKELETAL: Denies pain in neck, back, shoulder, knees, hips, or further arthritic symptoms.  NEUROLOGIC: Denies paralysis, paresthesias.  PSYCHIATRIC: Denies anxiety or depressive symptoms. Otherwise full review of systems performed by me is negative.   MEDICATIONS AT HOME:   Prior to Admission medications   Medication Sig Start Date End Date Taking? Authorizing Provider  acetaminophen (TYLENOL) 325 MG tablet Take 650 mg by mouth every 4 (four) hours as needed for mild pain or moderate pain.   Yes Historical Provider, MD  amLODipine (NORVASC) 5 MG tablet Take 5 mg by mouth daily.   Yes Historical Provider, MD  Cholecalciferol (VITAMIN D3) 50000 UNITS CAPS Take 50,000 Units by mouth every 30 (thirty) days. Take on 28th   Yes Historical Provider, MD  cloNIDine (CATAPRES) 0.2 MG tablet Take 0.2 mg by mouth 2 (two) times daily.   Yes Historical Provider, MD  lactulose, encephalopathy, (CHRONULAC) 10 GM/15ML SOLN Take 20 g by mouth at bedtime.   Yes Historical Provider, MD  loratadine (CLARITIN) 10 MG tablet Take 10 mg by mouth every morning.   Yes Historical Provider, MD  magnesium hydroxide (MILK OF MAGNESIA) 400 MG/5ML suspension Take 30 mLs by mouth daily as needed for mild constipation or moderate constipation.   Yes Historical Provider, MD  metoprolol tartrate (LOPRESSOR) 25 MG tablet Take 25 mg by mouth 2 (two) times daily.   Yes Historical Provider, MD  Oxcarbazepine (TRILEPTAL) 300 MG tablet Take 300 mg by mouth 2 (two) times daily.  Yes Historical Provider, MD  pravastatin (PRAVACHOL) 20 MG tablet Take 20 mg by mouth at bedtime.   Yes Historical Provider, MD  Pseudoephedrine-Acetaminophen (CEPACOL SORE THROAT PO) Take 1 lozenge by mouth every 2 (two) hours as needed (for sore throat.).   Yes Historical Provider, MD  rivaroxaban (XARELTO) 20 MG TABS tablet Take  20 mg by mouth daily.   Yes Historical Provider, MD      VITAL SIGNS:  Blood pressure 183/111, pulse 115, temperature 98.6 F (37 C), temperature source Oral, resp. rate 21, height 5\' 5"  (1.651 m), weight 183 lb 4.8 oz (83.144 kg), SpO2 95 %.  PHYSICAL EXAMINATION:  VITAL SIGNS: Filed Vitals:   10/12/14 2130  BP:   Pulse:   Temp:   Resp: 21   GENERAL:68 y.o.male currently in no acute distress, chronically ill-appearing HEAD: Normocephalic, atraumatic.  EYES: Pupils equal, round, reactive to light. Extraocular muscles intact. No scleral icterus.  MOUTH: Dry mucosal membrane. Dentition poor. No abscess noted.  EAR, NOSE, THROAT: Clear without exudates. No external lesions.  NECK: Supple. No thyromegaly. No nodules. No JVD.  PULMONARY: Clear to ascultation, without wheeze rails or rhonci. No use of accessory muscles, Good respiratory effort. good air entry bilaterally CHEST: Nontender to palpation.  CARDIOVASCULAR: S1 and S2. Tachycardic. No murmurs, rubs, or gallops. No edema. Pedal pulses 2+ bilaterally.  GASTROINTESTINAL: Soft, nontender, nondistended. No masses. Positive bowel sounds. No hepatosplenomegaly.  MUSCULOSKELETAL: No swelling, clubbing, or edema. Passive Range of motion full in all extremities.  NEUROLOGIC: Prominent right-sided facial droop, strength 4/5 right upper and lower extremity in comparison to left including flexion and extension SKIN: No ulceration, lesions, rashes, or cyanosis. Skin warm and dry. Turgor intact.  PSYCHIATRIC: Mood, affect within normal limits. The patient is awake, alert and oriented x person and place. Insight, judgment somewhat poor.    LABORATORY PANEL:   CBC  Recent Labs Lab 10/12/14 1817  WBC 13.0*  HGB 17.4  HCT 52.2*  PLT 256   ------------------------------------------------------------------------------------------------------------------  Chemistries   Recent Labs Lab 10/12/14 1817  NA 142  K 3.7  CL 105  CO2 24   GLUCOSE 113*  BUN 20  CREATININE 0.91  CALCIUM 9.7  AST 61*  ALT 44  ALKPHOS 104  BILITOT 0.5   ------------------------------------------------------------------------------------------------------------------  Cardiac Enzymes  Recent Labs Lab 10/12/14 1817  TROPONINI 0.03   ------------------------------------------------------------------------------------------------------------------  RADIOLOGY:  Ct Abdomen Pelvis W Contrast  10/12/2014   CLINICAL DATA:  Abdominal pain, vomiting, possible small bowel obstruction.  EXAM: CT ABDOMEN AND PELVIS WITH CONTRAST  TECHNIQUE: Multidetector CT imaging of the abdomen and pelvis was performed using the standard protocol following bolus administration of intravenous contrast.  CONTRAST:  OMNIPAQUE IOHEXOL 300 MG/ML  SOLN  COMPARISON:  X-ray of the abdomen same day  FINDINGS: Lung bases are unremarkable. Sagittal images of the spine shows mild degenerative changes lower thoracic spine. Enhanced liver is unremarkable. There is no evidence of gastric outlet obstruction. Enhanced pancreas, spleen and adrenal glands are unremarkable. No aortic aneurysm. Enhanced kidneys are symmetrical in size. No hydronephrosis or hydroureter. There is nonobstructive calculus midpole of the left kidney measures 2.2 mm. There is a cyst in midpole posterior aspect of the left kidney measures 1.4 cm.  Delayed renal images shows bilateral renal symmetrical excretion. Bilateral visualized proximal ureter is unremarkable.  There are distended small bowel loops with fluid in right abdomen. Fluid distended small bowel with and air-fluid levels are noted in mid lower abdomen.  There  is transition point in caliber of small bowel in axial image 42. Findings are consistent with small bowel obstruction. The terminal ileum is small caliber decompressed. This is a high grade obstruction.  In axial images 29 and 30 there is thickening of small bowel wall in right abdomen up to 8  mm. Findings are highly suspicious for small bowel edema or inflammation or small bowel wall. There is no definite evidence of intramural gas to suggest acute small bowel ischemia.  The right colon is decompressed. Moderate stool noted in transverse colon. Some stool noted in sigmoid colon and rectum. Nonspecific mild thickening of urinary bladder wall. Small right inguinal canal hernia containing fat measures 1.5 cm without evidence of acute complication. There is no adenopathy. No ascites or free air.  There is no pericecal inflammation. Normal appendix is noted in axial image 48.  Prostate gland and seminal vesicles are unremarkable.  Mild degenerative changes bilateral hip joints with mild narrowing of superior hip joint space.  IMPRESSION: 1. There are distended small bowel loops with air-fluid levels. There is transition point in caliber of small bowel in axial image 42. Findings are consistent with high-grade small bowel obstruction. 2. There is segmental thickening of small bowel wall in right abdomen please see axial image 29. Findings are consistent with small bowel wall edema or inflammation. No definite evidence of intramural gas to suggest acute small bowel ischemia. Clinical correlation is necessary. 3. Normal appendix.  No pericecal inflammation. 4. Nonspecific mild thickening of urinary bladder wall.   Electronically Signed   By: Natasha MeadLiviu  Pop M.D.   On: 10/12/2014 20:27   Dg Abd 2 Views  10/12/2014   CLINICAL DATA:  Coffee ground emesis and abdominal pain  EXAM: ABDOMEN - 2 VIEW  COMPARISON:  None.  FINDINGS: Scattered large and small bowel gas is noted. Very mild small bowel dilatation is noted. Fecal material is noted throughout the colon. Air is noted within the colon as well. No free air is seen. No abnormal mass or abnormal calcifications are noted. The osseous structures are within normal limits.  IMPRESSION: Mildly dilated small bowel which may be related to a small-bowel ileus or partial  small bowel obstruction.   Electronically Signed   By: Alcide CleverMark  Lukens M.D.   On: 10/12/2014 19:08    EKG:   Orders placed or performed during the hospital encounter of 10/12/14  . ED EKG  . ED EKG    IMPRESSION AND PLAN:   69 year old African-American gentleman history of essential hypertension, stroke unspecified type presenting with nausea and vomiting of coffee-ground emesis.  1. Small bowel obstruction: As noted on CT imaging, case discussed with general surgery as a are evaluated the patient, no acute surgical intervention at this time, ideally nasogastric tube decompression however patient is refusing at this time. Continue supportive care, nothing by mouth status, surgery will continue to follow 2. Hematemesis: Likely combination of the above problems with his anticoagulation, IV Protonix and hold Xarelto for now 3. Essential hypertension: Given nothing by mouth status, add when necessary hydralazine and IV Lopressor 4. Hyperlipidemia unspecified: Statin therapy when resume by mouth meds 5. Venous thromboembolism prophylactic: SCDs given active bleeding    All the records are reviewed and case discussed with ED provider. Management plans discussed with the patient, family and they are in agreement.  CODE STATUS: DO NOT RESUSCITATE  TOTAL TIME TAKING CARE OF THIS PATIENT: 45 minutes.    Araya Roel,  Mardi MainlandDavid K M.D on 10/12/2014 at 11:17  PM  Between 7am to 6pm - Pager - [phone]  After 6pm: House Pager: - [phone]  [facility]  Office  [phone]  CC: Primary care physician; Maryellen Pile

## 2014-10-12 NOTE — ED Notes (Signed)
Pt refused gastric tube. Pt states " I am not going to have any tube put in my nose." Pt was explained the benefits for the gastric tube. After teaching pt still refused the gastric tube.

## 2014-10-12 NOTE — ED Notes (Signed)
Spoke with dr hower who is aware of pt;s current vital signs and state give lopressor and pt can go to the floor. Dr Clint GuyHower also states do not put in ng tube if pt refusing.

## 2014-10-12 NOTE — ED Notes (Signed)
Dr Clint GuyHower in to see pt for admission.

## 2014-10-12 NOTE — ED Notes (Addendum)
Pt returned from radiol;ogy. Pt states he is feeling better and his pain is gone. Pt resting in nad at this time.

## 2014-10-12 NOTE — ED Notes (Signed)
Pt again refused NG tube. Dr Evette CristalSankar informed and states ok to leave it out. Pt remains tachycardic and hypertensive. MD aware.

## 2014-10-12 NOTE — ED Provider Notes (Addendum)
[facility] Emergency Department Provider Note     Time seen: ----------------------------------------- 5:50 PM on 10/12/2014 -----------------------------------------    I have reviewed the triage vital signs and the nursing notes.   HISTORY  Chief Complaint No chief complaint on file.    HPI Willie George is a 69 y.o. male resents ER for coffee ground emesis. Patient is DO NOT RESUSCITATE and is a resident of [facility] brought in by EMS for coffee-ground emesis times one. According to report patient was discussed with the physician on call for the nursing home was advised if he became tachycardic to send the ear ER immediately. Patient is complaining of abdominal pain as generalized, nothing makes it better or worse. Pain is mild to moderate at this time. He easily had one episode of coffee-ground emesis, states this has not happened to him before. Denies any other complaints.   No past medical history on file.  There are no active problems to display for this patient.   No past surgical history on file.  Allergies Review of patient's allergies indicates not on file.  Social History History  Substance Use Topics  . Smoking status: Not on file  . Smokeless tobacco: Not on file  . Alcohol Use: Not on file    Review of Systems Constitutional: Negative for fever. Eyes: Negative for visual changes. ENT: Negative for sore throat. Cardiovascular: Negative for chest pain. Respiratory: Negative for shortness of breath. Gastrointestinal: Negative for abdominal pain, positive for coffee ground emesis Genitourinary: Negative for dysuria. Musculoskeletal: Negative for back pain. Skin: Negative for rash. Neurological: Negative for headaches, focal weakness or numbness.  10-point ROS otherwise negative.  ____________________________________________   PHYSICAL EXAM:  VITAL SIGNS: ED Triage Vitals  Enc Vitals Group     BP --       Pulse --      Resp --      Temp --      Temp src --      SpO2 --      Weight --      Height --      Head Cir --      Peak Flow --      Pain Score --      Pain Loc --      Pain Edu? --      Excl. in GC? --     Constitutional:  Mild distress, generally ill appearing Eyes: Conjunctivae are normal. PERRL. Normal extraocular movements. ENT   Head: Normocephalic and atraumatic.   Nose: No congestion/rhinnorhea.   Mouth/Throat: Mucous membranes are moist.   Neck: No stridor. Hematological/Lymphatic/Immunilogical: No cervical lymphadenopathy. Cardiovascular: Rapid rate, regular rhythm Normal and symmetric distal pulses are present in all extremities. No murmurs, rubs, or gallops. Respiratory: Normal respiratory effort without tachypnea nor retractions. Breath sounds are clear and equal bilaterally. No wheezes/rales/rhonchi. Gastrointestinal: Soft, normal bowel sounds, generalized abdominal tenderness. No distention. No abdominal bruits.  Musculoskeletal: Nontender with normal range of motion in all extremities. No joint effusions.  No lower extremity tenderness nor edema. Neurologic:  Normal speech and language. No gross focal neurologic deficits are appreciated. Speech is normal. No gait instability. Skin:  Pallor with mild diaphoresis Psychiatric: Mood and affect are normal.  ____________________________________________  EKG: Interpreted by me. Sinus tachycardia with a rate of 130 bpm, wide QRS, there is evidence of ST depression laterally. Concerning for inferolateral ischemia.  ____________________________________________  ED COURSE:  Pertinent labs & imaging results that were  available during my care of the patient were reviewed by me and considered in my medical decision making (see chart for details). Patiently basic labs, imaging and IV Protonix. If he continues to have vomiting will need to place NG tube down. ____________________________________________     LABS (pertinent positives/negatives)  Labs Reviewed  CBC WITH DIFFERENTIAL/PLATELET - Abnormal; Notable for the following:    WBC 13.0 (*)    RBC 6.35 (*)    HCT 52.2 (*)    RDW 14.6 (*)    Neutro Abs 11.4 (*)    Lymphs Abs 0.9 (*)    All other components within normal limits  COMPREHENSIVE METABOLIC PANEL - Abnormal; Notable for the following:    Glucose, Bld 113 (*)    Total Protein 8.5 (*)    AST 61 (*)    All other components within normal limits  LIPASE, BLOOD - Abnormal; Notable for the following:    Lipase 13 (*)    All other components within normal limits  PROTIME-INR - Abnormal; Notable for the following:    Prothrombin Time 16.8 (*)    All other components within normal limits  TROPONIN I  TYPE AND SCREEN  ABO/RH    CRITICAL CARE Performed by: Emily Filbert   Total critical care time: 30  Critical care time was exclusive of separately billable procedures and treating other patients.  Critical care was necessary to treat or prevent imminent or life-threatening deterioration.  Critical care was time spent personally by me on the following activities: development of treatment plan with patient and/or surrogate as well as nursing, discussions with consultants, evaluation of patient's response to treatment, examination of patient, obtaining history from patient or surrogate, ordering and performing treatments and interventions, ordering and review of laboratory studies, ordering and review of radiographic studies, pulse oximetry and re-evaluation of patient's condition.   RADIOLOGY Images were viewed by me  Flat and erect abdomen, CT of the pelvis IMPRESSION: 1. There are distended small bowel loops with air-fluid levels. There is transition point in caliber of small bowel in axial image 42. Findings are consistent with high-grade small bowel obstruction. 2. There is segmental thickening of small bowel wall in right abdomen please see axial image 29.  Findings are consistent with small bowel wall edema or inflammation. No definite evidence of intramural gas to suggest acute small bowel ischemia. Clinical correlation is necessary. 3. Normal appendix. No pericecal inflammation. 4. Nonspecific mild thickening of urinary bladder wall. ____________________________________________  FINAL ASSESSMENT AND PLAN  Coffee-ground emesis, high-grade small bowel obstruction  Plan: Patient with labs and imaging as dictated above. Patient with an acute small bowel structure. NG tube will be placed, Gen. surgery will be consult. Patient will need to be admitted in the hospital. Unsure of the likelihood of surgery due to his DO NOT RESUSCITATE status.   Emily Filbert, MD   Emily Filbert, MD 10/12/14 8119  Emily Filbert, MD 10/12/14 [phone]

## 2014-10-12 NOTE — Consult Note (Signed)
  Date of consultation 10/12/2014  Consult requested by ED  Reason for consultation: Small bowel obstruction  History: This is a 69 year old male who was sent here from [facility] where he resides with a complaint of the patient had the abdominal pain and some vomiting. Full details of this are not available at present. Patient does state that he was having some pain mostly around the umbilical area of the abdomen and had vomited. Unclear as to duration of his pain or the   number of episodes of vomiting.   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 He was admitted to the hospital here at that time and subsequently placed on anticoagulation. History of hypertension. History of anxiety.  There are no active problems to display for this patient.   ROS:  patient denies any symptoms of abdominal pain and vomiting. No dysuria or change in bowel habits.No fever or chills. No chest pain.  Meds: Patient currently on Xarelto in addition to his antihypertensives medications -list reviewed  Examination : Patient is awake and alert. He is tachycardic with a heart rate of 110-120 and has a blood pressure of 158/118  Has what appears to be a left facial paralysis. Conjunctiva is pink. No scleral icterus. Neck supple and no nodes or masses palpable Lungs clear to auscultation percussion  Heart sinus tachycardia no murmurs  Abdomen is mildly distended but does not appear to be particularly tympanitic, .bowel sounds are hypoactive and there is some nonfocal tenderness in the mid abdominal area without guarding or rebound. A small fat-containing umbilical hernia is noted . Extremities no edema. Patient has 2+ dorsalis pedis pulse on the right and 1+ on the left. Posterior tibials are nonpalpable .  Lab data : his white count is 13,000 and hemoglobin is 17.3, chemistries are normal  CT scan of the abdomen shows evidence of a small bowel obstruction with what appears to be a  transition point in the right mid abdominal area.  Impression and recommendation. Small bowel obstruction which appears to be without any apparent cause of this time. Based on his high hemoglobin and tachycardia appears that he may be dehydrated. The patient is a DO NOT RESUSCITATE. At present there is no indication to proceed with laparotomy- obstruction is not complicated by signs of bowel compromise. Also being on Xarelto may incur significant blood loss.Recommend conservative management with adequate hydration and nothing by mouth. Patient apparently refused an NG tube.  Patient will be admitted by hospitalist and surgery will follow.

## 2014-10-12 NOTE — ED Notes (Signed)
Pt remains in radiology 

## 2014-10-13 ENCOUNTER — Encounter: Payer: Self-pay | Admitting: Specialist

## 2014-10-13 ENCOUNTER — Inpatient Hospital Stay: Payer: Medicare Other

## 2014-10-13 DIAGNOSIS — K5669 Other intestinal obstruction: Secondary | ICD-10-CM

## 2014-10-13 LAB — BASIC METABOLIC PANEL
Anion gap: 10 (ref 5–15)
BUN: 17 mg/dL (ref 6–20)
CALCIUM: 8.8 mg/dL — AB (ref 8.9–10.3)
CHLORIDE: 111 mmol/L (ref 101–111)
CO2: 22 mmol/L (ref 22–32)
CREATININE: 0.86 mg/dL (ref 0.61–1.24)
GFR calc Af Amer: >60 mL/min (ref >60)
Glucose, Bld: 135 mg/dL — ABNORMAL HIGH (ref 65–99)
POTASSIUM: 3.5 mmol/L (ref 3.5–5.1)
Sodium: 143 mmol/L (ref 135–145)

## 2014-10-13 LAB — CBC
HCT: 47 % (ref 40.0–52.0)
HEMOGLOBIN: 15.9 g/dL (ref 13.0–18.0)
MCH: 27.8 pg (ref 26.0–34.0)
MCHC: 33.8 g/dL (ref 32.0–36.0)
MCV: 82.4 fL (ref 80.0–100.0)
PLATELETS: 245 10*3/uL (ref 150–440)
RBC: 5.71 MIL/uL (ref 4.40–5.90)
RDW: 14.9 % — ABNORMAL HIGH (ref 11.5–14.5)
WBC: 9.1 10*3/uL (ref 3.8–10.6)

## 2014-10-13 LAB — MRSA PCR SCREENING: MRSA BY PCR: NEGATIVE

## 2014-10-13 MED ORDER — HYDRALAZINE HCL 20 MG/ML IJ SOLN
10.0000 mg | Freq: Once | INTRAMUSCULAR | Status: AC
  Administered 2014-10-13: 10 mg via INTRAVENOUS
  Filled 2014-10-13: qty 1

## 2014-10-13 MED ORDER — SODIUM CHLORIDE 0.9 % IV SOLN
500.0000 mg | Freq: Two times a day (BID) | INTRAVENOUS | Status: DC
  Administered 2014-10-13 – 2014-10-15 (×5): 500 mg via INTRAVENOUS
  Filled 2014-10-13 (×8): qty 5

## 2014-10-13 MED ORDER — HYDRALAZINE HCL 20 MG/ML IJ SOLN: 10.0000 mg | INTRAMUSCULAR | Status: DC | PRN

## 2014-10-13 MED ORDER — PANTOPRAZOLE SODIUM 40 MG IV SOLR
40.0000 mg | Freq: Two times a day (BID) | INTRAVENOUS | Status: DC
  Administered 2014-10-13 – 2014-10-16 (×8): 40 mg via INTRAVENOUS
  Filled 2014-10-13 (×8): qty 40

## 2014-10-13 NOTE — Progress Notes (Signed)
Patient alert but confused.  BP has been elevated but scheduled Metoprolol given.  No complaints of pain or nausea this shift.  Bowel sounds are hypoactive, patient states he has not passed flatus today.  NG tube was placed and then he pulled it out and would not allow us to place in back, Dr. Juliann PulseLundquist was notified and acknowleged.  Voiding incontinent.  No significant changes. Continue to monitor.

## 2014-10-13 NOTE — Progress Notes (Signed)
Patient arrived to unit around 0030 from the ER with nausea/vomiting.  Patient had coffee ground emesis.  CT showed a small bowel obstruction.  Patient refused NG tube and was very hypertensive with a high pulse rate as well.  Patient was placed on telemetry.  Gave pt morphine for pain and had about 100 ml of black/brown emesis.  Patient is NPO and abdominal X rays were ordered.  Arturo MortonClay, Petrice Beedy N  10/13/2014  7:03 AM

## 2014-10-13 NOTE — ED Notes (Signed)
Misty StanleyStacey, RN on 2C updated on pt and pt to the floor.

## 2014-10-13 NOTE — Progress Notes (Signed)
Surgery Progress Note  S: Still C/o abdominal pain, vomiting.  Per patient, has had many more frequent stools prior to this O: Blood pressure 146/91, pulse 97, temperature 98.9 F (37.2 C), temperature source Oral, resp. rate 17, height 5\' 5"  (1.651 m), weight 83.054 kg (183 lb 1.6 oz), SpO2 93 %. GEN: NAD/A&Ox3 ABD: soft, mild distention, mild tender to palpation  Labs: Reviewed, significant for  WBC 9.1  ABD: xray, reviewed - mildly improved sb distention  A/P 69 yo M admit with abd pain, ? SBO.  Has significant sb thickening on CT scan without pneumotosis.  Symptoms may be due to enteritis, which will involve spontaneously.  Would treat as SBO based on ?transition point.  Have recommended to him NG tube again. Will continue to have nurses recommend this to him. Is on xarelto so will attempt nonoperative management until it wears off.

## 2014-10-13 NOTE — ED Notes (Signed)
Report to Kennyth ArnoldStacy, RN on 2C. Pt remains tachy and hypertensive. Will speak  with Dr Clint GuyHower andlet him know  hydralazine. Has been given and pt still tachy.

## 2014-10-13 NOTE — Progress Notes (Signed)
[facility] [place] at [facility]   PATIENT NAME: Willie EchevariaJames Denker    MR#:  [id number]  DATE OF BIRTH:  06/29/1945  SUBJECTIVE:  CHIEF COMPLAINT:   Chief Complaint  Patient presents with  . Hematemesis   Patient here with abdominal pain, nausea, vomiting and noted to have a partial small bowel obstruction. Had one episode of vomiting this morning. Still has some mild abdominal pain.   REVIEW OF SYSTEMS:    Review of Systems  Constitutional: Negative for fever and chills.  HENT: Negative for congestion and tinnitus.   Eyes: Negative for blurred vision and double vision.  Respiratory: Negative for cough, shortness of breath and wheezing.   Cardiovascular: Negative for chest pain, orthopnea and PND.  Gastrointestinal: Positive for nausea, vomiting and abdominal pain. Negative for diarrhea.  Genitourinary: Negative for dysuria and hematuria.  Neurological: Negative for dizziness, sensory change and focal weakness.  All other systems reviewed and are negative.   Nutrition: NPO Tolerating Diet: no due to SBO   DRUG ALLERGIES:  No Known Allergies  VITALS:  Blood pressure 146/91, pulse 97, temperature 98.9 F (37.2 C), temperature source Oral, resp. rate 17, height 5\' 5"  (1.651 m), weight 83.054 kg (183 lb 1.6 oz), SpO2 93 %.  PHYSICAL EXAMINATION:   Physical Exam  GENERAL:  69 y.o.-year-old patient lying in the bed with no acute distress.  EYES: Pupils equal, round, reactive to light and accommodation. No scleral icterus. Extraocular muscles intact.  HEENT: Head atraumatic, normocephalic. Oropharynx and nasopharynx clear.  NECK:  Supple, no jugular venous distention. No thyroid enlargement, no tenderness.  LUNGS: Normal breath sounds bilaterally, no wheezing, rales, rhonchi. No use of accessory muscles of respiration.  CARDIOVASCULAR: S1, S2 RRR. No murmurs, rubs, or gallops.  ABDOMEN: Soft, distended, hypoactive bowel sounds, no rebound/rigidity. No  organomegaly appreciated. EXTREMITIES: No cyanosis, clubbing or edema b/l.    NEUROLOGIC: Cranial nerves II through XII are intact. No focal Motor or sensory deficits b/l.   PSYCHIATRIC: The patient is alert and oriented x 3.  SKIN: No obvious rash, lesion, or ulcer.    LABORATORY PANEL:   CBC  Recent Labs Lab 10/13/14 0403  WBC 9.1  HGB 15.9  HCT 47.0  PLT 245   ------------------------------------------------------------------------------------------------------------------  Chemistries   Recent Labs Lab 10/12/14 1817 10/13/14 0403  NA 142 143  K 3.7 3.5  CL 105 111  CO2 24 22  GLUCOSE 113* 135*  BUN 20 17  CREATININE 0.91 0.86  CALCIUM 9.7 8.8*  AST 61*  --   ALT 44  --   ALKPHOS 104  --   BILITOT 0.5  --    ------------------------------------------------------------------------------------------------------------------  Cardiac Enzymes  Recent Labs Lab 10/12/14 1817  TROPONINI 0.03   ------------------------------------------------------------------------------------------------------------------  RADIOLOGY:  Ct Abdomen Pelvis W Contrast  10/12/2014   CLINICAL DATA:  Abdominal pain, vomiting, possible small bowel obstruction.  EXAM: CT ABDOMEN AND PELVIS WITH CONTRAST  TECHNIQUE: Multidetector CT imaging of the abdomen and pelvis was performed using the standard protocol following bolus administration of intravenous contrast.  CONTRAST:  100mL OMNIPAQUE IOHEXOL 300 MG/ML  SOLN  COMPARISON:  X-ray of the abdomen same day  FINDINGS: Lung bases are unremarkable. Sagittal images of the spine shows mild degenerative changes lower thoracic spine. Enhanced liver is unremarkable. There is no evidence of gastric outlet obstruction. Enhanced pancreas, spleen and adrenal glands are unremarkable. No aortic aneurysm. Enhanced kidneys are symmetrical in size. No hydronephrosis or hydroureter. There is nonobstructive  calculus midpole of the left kidney measures 2.2 mm.  There is a cyst in midpole posterior aspect of the left kidney measures 1.4 cm.  Delayed renal images shows bilateral renal symmetrical excretion. Bilateral visualized proximal ureter is unremarkable.  There are distended small bowel loops with fluid in right abdomen. Fluid distended small bowel with and air-fluid levels are noted in mid lower abdomen.  There is transition point in caliber of small bowel in axial image 42. Findings are consistent with small bowel obstruction. The terminal ileum is small caliber decompressed. This is a high grade obstruction.  In axial images 29 and 30 there is thickening of small bowel wall in right abdomen up to 8 mm. Findings are highly suspicious for small bowel edema or inflammation or small bowel wall. There is no definite evidence of intramural gas to suggest acute small bowel ischemia.  The right colon is decompressed. Moderate stool noted in transverse colon. Some stool noted in sigmoid colon and rectum. Nonspecific mild thickening of urinary bladder wall. Small right inguinal canal hernia containing fat measures 1.5 cm without evidence of acute complication. There is no adenopathy. No ascites or free air.  There is no pericecal inflammation. Normal appendix is noted in axial image 48.  Prostate gland and seminal vesicles are unremarkable.  Mild degenerative changes bilateral hip joints with mild narrowing of superior hip joint space.  IMPRESSION: 1. There are distended small bowel loops with air-fluid levels. There is transition point in caliber of small bowel in axial image 42. Findings are consistent with high-grade small bowel obstruction. 2. There is segmental thickening of small bowel wall in right abdomen please see axial image 29. Findings are consistent with small bowel wall edema or inflammation. No definite evidence of intramural gas to suggest acute small bowel ischemia. Clinical correlation is necessary. 3. Normal appendix.  No pericecal inflammation. 4.  Nonspecific mild thickening of urinary bladder wall.   Electronically Signed   By: Natasha Mead M.D.   On: 10/12/2014 20:27   Dg Abd 2 Views  10/13/2014   CLINICAL DATA:  69 year old male with bowel obstruction and vomiting  EXAM: ABDOMEN - 2 VIEW  COMPARISON:  Abdominal radiographs and CT scan 10/12/2014  FINDINGS: Increasing linear atelectasis in the left lower lobe. Slightly improved gaseous distension of small bowel in the upper abdomen. No free air on the upright view.  IMPRESSION: 1. Slightly improved gaseous distension of small bowel on the upper abdomen. 2. Developing left lower lobe atelectasis.   Electronically Signed   By: Malachy Moan M.D.   On: 10/13/2014 08:51   Dg Abd 2 Views  10/12/2014   CLINICAL DATA:  Coffee ground emesis and abdominal pain  EXAM: ABDOMEN - 2 VIEW  COMPARISON:  None.  FINDINGS: Scattered large and small bowel gas is noted. Very mild small bowel dilatation is noted. Fecal material is noted throughout the colon. Air is noted within the colon as well. No free air is seen. No abnormal mass or abnormal calcifications are noted. The osseous structures are within normal limits.  IMPRESSION: Mildly dilated small bowel which may be related to a small-bowel ileus or partial small bowel obstruction.   Electronically Signed   By: Alcide Clever M.D.   On: 10/12/2014 19:08     ASSESSMENT AND PLAN:   69 year old male with past medical history of dementia, history of previous CVA, hypertension, hyperlipidemia, history of seizures who presented to the hospital due to abdominal pain, nausea, vomiting which was coffee-ground  in color.  #1 small bowel obstruction-this is likely the cause of patient's nausea vomiting. Patient CT scan on admission was consistent with this. -Patient has been seen by surgery and no plans on acute surgical intervention. We'll place NG tube for decompression and continue supportive care with IV fluids, antiemetics, pain meds. -We'll repeat KUB in  a.m.  #2 GI bleed-patient presented with coffee-ground emesis. This is likely an upper GI bleed. -Hemoglobin stable, no evidence of acute bleeding overnight. -Continue Protonix, hold Xarelto.  #3 history of seizures-patient is on Trileptal at home but currently is nothing by mouth. -Continue IV Keppra.  #4 hypertension-continue IV metoprolol, when necessary IV hydralazine.  #5 history of previous CVA-hold Xarelto given the GI bleed.   All the records are reviewed and case discussed with Care Management/Social Workerr. Management plans discussed with the patient, family and they are in agreement.  CODE STATUS: DO NOT RESUSCITATE  DVT Prophylaxis: Teds and SCDs  TOTAL TIME TAKING CARE OF THIS PATIENT: 30 minutes.   POSSIBLE D/C IN 2-3 DAYS, DEPENDING ON CLINICAL CONDITION.   Houston Siren M.D on 10/13/2014 at 12:25 PM  Between 7am to 6pm - Pager - [phone]  After 6pm go to [email or web address] - password [email or web address] [facility]  [place] [facility]  Office  [phone]  CC: Primary care physician; No primary care provider on file.

## 2014-10-14 ENCOUNTER — Inpatient Hospital Stay: Payer: Medicare Other

## 2014-10-14 DIAGNOSIS — R140 Abdominal distension (gaseous): Secondary | ICD-10-CM | POA: Insufficient documentation

## 2014-10-14 DIAGNOSIS — K56609 Unspecified intestinal obstruction, unspecified as to partial versus complete obstruction: Secondary | ICD-10-CM | POA: Insufficient documentation

## 2014-10-14 DIAGNOSIS — R109 Unspecified abdominal pain: Secondary | ICD-10-CM | POA: Insufficient documentation

## 2014-10-14 DIAGNOSIS — R100 Acute abdomen: Secondary | ICD-10-CM

## 2014-10-14 LAB — BASIC METABOLIC PANEL
ANION GAP: 7 (ref 5–15)
BUN: 13 mg/dL (ref 6–20)
CO2: 23 mmol/L (ref 22–32)
Calcium: 8.7 mg/dL — ABNORMAL LOW (ref 8.9–10.3)
Chloride: 110 mmol/L (ref 101–111)
Creatinine, Ser: 0.81 mg/dL (ref 0.61–1.24)
GFR calc non Af Amer: >60 mL/min (ref >60)
Glucose, Bld: 89 mg/dL (ref 65–99)
POTASSIUM: 3.3 mmol/L — AB (ref 3.5–5.1)
Sodium: 140 mmol/L (ref 135–145)

## 2014-10-14 LAB — CBC
HEMATOCRIT: 41.3 % (ref 40.0–52.0)
Hemoglobin: 13.7 g/dL (ref 13.0–18.0)
MCH: 27.4 pg (ref 26.0–34.0)
MCHC: 33.1 g/dL (ref 32.0–36.0)
MCV: 82.7 fL (ref 80.0–100.0)
Platelets: 190 10*3/uL (ref 150–440)
RBC: 4.99 MIL/uL (ref 4.40–5.90)
RDW: 14.8 % — ABNORMAL HIGH (ref 11.5–14.5)
WBC: 8.6 10*3/uL (ref 3.8–10.6)

## 2014-10-14 MED ORDER — POTASSIUM CHLORIDE CRYS ER 20 MEQ PO TBCR
20.0000 meq | EXTENDED_RELEASE_TABLET | Freq: Two times a day (BID) | ORAL | Status: DC
Start: 2014-10-14 — End: 2014-10-17
  Administered 2014-10-14 – 2014-10-17 (×7): 20 meq via ORAL
  Filled 2014-10-14 (×7): qty 1

## 2014-10-14 NOTE — Progress Notes (Signed)
Initial Nutrition Assessment    INTERVENTION:    Meals and snacks: Cater to pt preferences  NUTRITION DIAGNOSIS:   Inadequate oral intake related to altered GI function as evidenced by NPO status.    GOAL:   Patient will meet greater than or equal to 90% of their needs    MONITOR:    (Energy intake, Digestive system)  REASON FOR ASSESSMENT:   Malnutrition Screening Tool    ASSESSMENT:   Pt admitted with hemetemesis, abdominal pain, nausea, vomiting, partial small bowel obstruction.    Past Medical History  Diagnosis Date  . Hypertension   . Stroke   . Dementia     Current Nutrition: NPO, diet just progressed to clear liquids but no tray received yet  Food/Nutrition-Related History: pt reports decreased intake for the past few days prior to admission secondary to abdominal pain and vomiting   Medications: NS at 16500ml/hr, protonix  Electrolyte/Renal Profile and Glucose Profile:   Recent Labs Lab 10/12/14 1817 10/13/14 0403 10/14/14 0502  NA 142 143 140  K 3.7 3.5 3.3*  CL 105 111 110  CO2 24 22 23   BUN 20 17 13   CREATININE 0.91 0.86 0.81  CALCIUM 9.7 8.8* 8.7*  GLUCOSE 113* 135* 89   Protein Profile:  Recent Labs Lab 10/12/14 1817  ALBUMIN 4.4    Gastrointestinal Profile: NG tube placed but pt pulled out Last BM: unsure   Nutrition-Focused Physical Exam Findings: Nutrition-Focused physical exam completed. Findings are WDL for fat depletion, muscle depletion, and edema.     Weight Change: pt unsure if has lost weight    Diet Order:  Diet clear liquid Room service appropriate?: Yes; Fluid consistency:: Thin  Skin:  Reviewed, no issues     Height:   Ht Readings from Last 1 Encounters:  10/13/14 5\' 5"  (1.651 m)    Weight:   Wt Readings from Last 1 Encounters:  10/13/14 183 lb 1.6 oz (83.054 kg)        Wt Readings from Last 10 Encounters:  10/13/14 183 lb 1.6 oz (83.054 kg)    BMI:  Body mass index is 30.47  kg/(m^2).  Estimated Nutritional Needs:   Kcal:  Using IBW of 62kg BEE 1326 kcals (IF 1.0-1.2, AF 1.3) [id number] kcals/d.   Protein:  Using IBW of 62kg (1.0-1.2 gm/d) 62-74 gm/d  Fluid:  Using IBW of 62kg (25-2830ml/kg) 1550-186560ml/d  EDUCATION NEEDS:   No education needs identified at this time  MODERATE Care Level Jordell Outten B. Freida BusmanAllen, RD, LDN [phone] (pager)

## 2014-10-14 NOTE — Progress Notes (Signed)
[facility] - [place] at [facility]   PATIENT NAME: Willie George    MR#:  [id number]  DATE OF BIRTH:  09/12/1945  SUBJECTIVE:  CHIEF COMPLAINT:   Chief Complaint  Patient presents with  . Hematemesis   Patient here with abdominal pain, nausea, vomiting and noted to have a partial small bowel obstruction. Had NG tube placed yesterday but he pulled it out. KUB this a.m. Showing improvement in SBO.  Family at bedside.  No N/V this a.m.   REVIEW OF SYSTEMS:    Review of Systems  Constitutional: Negative for fever and chills.  HENT: Negative for congestion and tinnitus.   Eyes: Negative for blurred vision and double vision.  Respiratory: Negative for cough, shortness of breath and wheezing.   Cardiovascular: Negative for chest pain, orthopnea and PND.  Gastrointestinal: Positive for abdominal pain. Negative for nausea, vomiting and diarrhea.  Genitourinary: Negative for dysuria and hematuria.  Neurological: Negative for dizziness, sensory change and focal weakness.  All other systems reviewed and are negative.   Nutrition: Clear liquids Tolerating Diet: Yes  DRUG ALLERGIES:  No Known Allergies  VITALS:  Blood pressure 154/77, pulse 70, temperature 97.6 F (36.4 C), temperature source Oral, resp. rate 16, height 5\' 5"  (1.651 m), weight 83.054 kg (183 lb 1.6 oz), SpO2 96 %.  PHYSICAL EXAMINATION:   Physical Exam  GENERAL:  69 y.o.-year-old patient lying in the bed with no acute distress.  EYES: Pupils equal, round, reactive to light and accommodation. No scleral icterus. Extraocular muscles intact.  HEENT: Head atraumatic, normocephalic. Oropharynx and nasopharynx clear.  NECK:  Supple, no jugular venous distention. No thyroid enlargement, no tenderness.  LUNGS: Normal breath sounds bilaterally, no wheezing, rales, rhonchi. No use of accessory muscles of respiration.  CARDIOVASCULAR: S1, S2 RRR. No murmurs, rubs, or gallops.  ABDOMEN: Soft, distended,  hypoactive bowel sounds, no rebound/rigidity. No organomegaly appreciated. EXTREMITIES: No cyanosis, clubbing or edema b/l.    NEUROLOGIC: Cranial nerves II through XII are intact. No focal Motor or sensory deficits b/l.   PSYCHIATRIC: The patient is alert and oriented x 3. Good affect.  SKIN: No obvious rash, lesion, or ulcer.    LABORATORY PANEL:   CBC  Recent Labs Lab 10/14/14 0502  WBC 8.6  HGB 13.7  HCT 41.3  PLT 190   ------------------------------------------------------------------------------------------------------------------  Chemistries   Recent Labs Lab 10/12/14 1817  10/14/14 0502  NA 142  < > 140  K 3.7  < > 3.3*  CL 105  < > 110  CO2 24  < > 23  GLUCOSE 113*  < > 89  BUN 20  < > 13  CREATININE 0.91  < > 0.81  CALCIUM 9.7  < > 8.7*  AST 61*  --   --   ALT 44  --   --   ALKPHOS 104  --   --   BILITOT 0.5  --   --   < > = values in this interval not displayed. ------------------------------------------------------------------------------------------------------------------  Cardiac Enzymes  Recent Labs Lab 10/12/14 1817  TROPONINI 0.03   ------------------------------------------------------------------------------------------------------------------  RADIOLOGY:  Dg Abd 1 View  10/14/2014   CLINICAL DATA:  Abdominal pain.  Nausea and vomiting.  EXAM: ABDOMEN - 1 VIEW  COMPARISON:  10/13/2014.  CT 10/12/2014  FINDINGS: Soft tissue structures are unremarkable. Interim improvement of small bowel distention. Colonic gas pattern is normal . No pneumatosis. No free air. No acute bony abnormality.  IMPRESSION: Interim improvement of small bowel  distention consistent with improving small partial small bowel obstruction .   Electronically Signed   By: Maisie Fushomas  Register   On: 10/14/2014 10:29   Ct Abdomen Pelvis W Contrast  10/12/2014   CLINICAL DATA:  Abdominal pain, vomiting, possible small bowel obstruction.  EXAM: CT ABDOMEN AND PELVIS WITH CONTRAST   TECHNIQUE: Multidetector CT imaging of the abdomen and pelvis was performed using the standard protocol following bolus administration of intravenous contrast.  CONTRAST:  100mL OMNIPAQUE IOHEXOL 300 MG/ML  SOLN  COMPARISON:  X-ray of the abdomen same day  FINDINGS: Lung bases are unremarkable. Sagittal images of the spine shows mild degenerative changes lower thoracic spine. Enhanced liver is unremarkable. There is no evidence of gastric outlet obstruction. Enhanced pancreas, spleen and adrenal glands are unremarkable. No aortic aneurysm. Enhanced kidneys are symmetrical in size. No hydronephrosis or hydroureter. There is nonobstructive calculus midpole of the left kidney measures 2.2 mm. There is a cyst in midpole posterior aspect of the left kidney measures 1.4 cm.  Delayed renal images shows bilateral renal symmetrical excretion. Bilateral visualized proximal ureter is unremarkable.  There are distended small bowel loops with fluid in right abdomen. Fluid distended small bowel with and air-fluid levels are noted in mid lower abdomen.  There is transition point in caliber of small bowel in axial image 42. Findings are consistent with small bowel obstruction. The terminal ileum is small caliber decompressed. This is a high grade obstruction.  In axial images 29 and 30 there is thickening of small bowel wall in right abdomen up to 8 mm. Findings are highly suspicious for small bowel edema or inflammation or small bowel wall. There is no definite evidence of intramural gas to suggest acute small bowel ischemia.  The right colon is decompressed. Moderate stool noted in transverse colon. Some stool noted in sigmoid colon and rectum. Nonspecific mild thickening of urinary bladder wall. Small right inguinal canal hernia containing fat measures 1.5 cm without evidence of acute complication. There is no adenopathy. No ascites or free air.  There is no pericecal inflammation. Normal appendix is noted in axial image 48.   Prostate gland and seminal vesicles are unremarkable.  Mild degenerative changes bilateral hip joints with mild narrowing of superior hip joint space.  IMPRESSION: 1. There are distended small bowel loops with air-fluid levels. There is transition point in caliber of small bowel in axial image 42. Findings are consistent with high-grade small bowel obstruction. 2. There is segmental thickening of small bowel wall in right abdomen please see axial image 29. Findings are consistent with small bowel wall edema or inflammation. No definite evidence of intramural gas to suggest acute small bowel ischemia. Clinical correlation is necessary. 3. Normal appendix.  No pericecal inflammation. 4. Nonspecific mild thickening of urinary bladder wall.   Electronically Signed   By: Natasha MeadLiviu  Pop M.D.   On: 10/12/2014 20:27   Dg Abd 2 Views  10/13/2014   CLINICAL DATA:  69 year old male with bowel obstruction and vomiting  EXAM: ABDOMEN - 2 VIEW  COMPARISON:  Abdominal radiographs and CT scan 10/12/2014  FINDINGS: Increasing linear atelectasis in the left lower lobe. Slightly improved gaseous distension of small bowel in the upper abdomen. No free air on the upright view.  IMPRESSION: 1. Slightly improved gaseous distension of small bowel on the upper abdomen. 2. Developing left lower lobe atelectasis.   Electronically Signed   By: Malachy MoanHeath  McCullough M.D.   On: 10/13/2014 08:51   Dg Abd 2 Views  10/12/2014   CLINICAL DATA:  Coffee ground emesis and abdominal pain  EXAM: ABDOMEN - 2 VIEW  COMPARISON:  None.  FINDINGS: Scattered large and small bowel gas is noted. Very mild small bowel dilatation is noted. Fecal material is noted throughout the colon. Air is noted within the colon as well. No free air is seen. No abnormal mass or abnormal calcifications are noted. The osseous structures are within normal limits.  IMPRESSION: Mildly dilated small bowel which may be related to a small-bowel ileus or partial small bowel obstruction.    Electronically Signed   By: Alcide Clever M.D.   On: 10/12/2014 19:08     ASSESSMENT AND PLAN:   69 year old male with past medical history of dementia, history of previous CVA, hypertension, hyperlipidemia, history of seizures who presented to the hospital due to abdominal pain, nausea, vomiting which was coffee-ground in color.  #1 small bowel obstruction-this is likely the cause of patient's nausea vomiting. Patient CT scan on admission was consistent with this. -Patient has been seen by surgery and no plans on acute surgical intervention. NG tube was placed yesterday but removed by pt. As he could not tolerate it.  KUB this a.m. Shows improvement and therefore started on clear liquid diet.   - cont. supportive care with IV fluids, antiemetics, pain meds. -We'll repeat KUB in a.m.  #2 GI bleed-patient presented with coffee-ground emesis. This is likely an upper GI bleed. -Hemoglobin stable, no evidence of acute bleeding overnight. -Continue Protonix, hold Xarelto.  #3 history of seizures-patient is on Trileptal at home. If continues to improved will switch to oral meds from IV Keppra.  #4 hypertension-continue IV metoprolol, when necessary IV hydralazine.  #5 history of previous CVA-hold Xarelto given the GI bleed.   All the records are reviewed and case discussed with Care Management/Social Workerr. Management plans discussed with the patient, family and they are in agreement.  CODE STATUS: DO NOT RESUSCITATE  DVT Prophylaxis: Teds and SCDs  TOTAL TIME TAKING CARE OF THIS PATIENT: 25 minutes.   POSSIBLE D/C IN 2-3 DAYS, DEPENDING ON CLINICAL CONDITION.   Houston Siren M.D on 10/14/2014 at 1:24 PM  Between 7am to 6pm - Pager - [phone]  After 6pm go to [email or web address] - password [email or web address] [facility]  [facility]  Office  [phone]  CC: Primary care physician; No primary care provider on file.

## 2014-10-14 NOTE — Clinical Social Work Note (Signed)
Clinical Social Work Assessment  Patient Details  Name: Willie George MRN: [id number] Date of Birth: 12/03/1945  Date of referral:  10/14/14               Reason for consult:  Facility Placement                Permission sought to share information with:  Facility Industrial/product designerContact Representative Permission granted to share information::  Yes, Verbal Permission Granted  Name::        Agency::     Relationship::     Contact Information:     Housing/Transportation Living arrangements for the past 2 months:  Skilled Nursing Facility Source of Information:  Patient Patient Interpreter Needed:  None Criminal Activity/Legal Involvement Pertinent to Current Situation/Hospitalization:  No - Comment as needed Significant Relationships:  Friend, Siblings, Adult Children Lives with:  Facility Resident Do you feel safe going back to the place where you live?  Yes Need for family participation in patient care:  No (Coment)  Care giving concerns:  [facility]   Social Worker assessment / plan:  CSW spoke with patient this afternoon and patient [facility] Patient states that he will return at discharge there. When asked who his main contact was, he stated he would want it to be his daughter, Willie George. CSW explained that we didn't have any contact info for Danielle. He didn't remember that he had a sister and asked CSW to tell him her name. He then remembered once her name was given: Willie George. CSW asked patient who the [facility] and he stated that she was a close friend: Willie George. He stated he was going to think about whether or not he wanted to change his contacts. [facility] daughter had not visited or called the entire time patient had been at their facility.  FL2 placed on patient's chart. CSW will continue to follow.  Employment status:  Retired Health and safety inspectornsurance information:   Medicare PT Recommendations:  Not assessed at this time Information / Referral to community resources:  Skilled Nursing Facility  Patient/Family's Response to care:  Patient appeared somewhat guarded. It is unclear to CSW how much patient was aware of his surroundings and what he was in the hospital for.   Patient/Family's Understanding of and Emotional Response to Diagnosis, Current Treatment, and Prognosis:  Patient expressed appreciation for CSW visit.  Emotional Assessment Appearance:  Appears stated age Attitude/Demeanor/Rapport:   (pleasant and cooperative) Affect (typically observed):  Appropriate Orientation:  Oriented to Self, Oriented to Place, Oriented to Situation Alcohol / Substance use:  Not Applicable Psych involvement (Current and /or in the community):  No (Comment)  Discharge Needs  Concerns to be addressed:  Care Coordination Readmission within the last 30 days:  No Current discharge risk:  None Barriers to Discharge:  No Barriers Identified   York SpanielMonica Kyrstin Campillo, LCSW 10/14/2014, 3:29 PM

## 2014-10-14 NOTE — Progress Notes (Signed)
CC: Abdominal pain Subjective: Patient admitted with a diagnosis of possible partial small bowel obstruction. Patient states he has ongoing abdominal pain has not passed any gas but has not vomited. He denies fevers or chills. Patient appears confused and is difficult to understand however.  Objective: Vital signs in last 24 hours: Temp:  [97.6 F (36.4 C)-98.6 F (37 C)] 97.6 F (36.4 C) (07/18 0748) Pulse Rate:  [46-98] 70 (07/18 0748) Resp:  [16-18] 16 (07/18 0748) BP: (114-175)/(69-112) 154/77 mmHg (07/18 0748) SpO2:  [96 %-100 %] 96 % (07/18 0748)    Intake/Output from previous day: 07/17 0701 - 07/18 0700 In: 2655 [I.V.:2555; IV Piggyback:100] Out: -  Intake/Output this shift:    Physical exam:  Abdomen is soft and minimally distended minimally tympanitic and nontender. No peritoneal signs.  Patient appears confused.  Calves are nontender.  Lab Results: CBC   Recent Labs  10/13/14 0403 10/14/14 0502  WBC 9.1 8.6  HGB 15.9 13.7  HCT 47.0 41.3  PLT 245 190   BMET  Recent Labs  10/13/14 0403 10/14/14 0502  NA 143 140  K 3.5 3.3*  CL 111 110  CO2 22 23  GLUCOSE 135* 89  BUN 17 13  CREATININE 0.86 0.81  CALCIUM 8.8* 8.7*   PT/INR  Recent Labs  10/12/14 1817  LABPROT 16.8*  INR 1.34   ABG No results for input(s): PHART, HCO3 in the last 72 hours.  Invalid input(s): PCO2, PO2  Studies/Results: Dg Abd 1 View  10/14/2014   CLINICAL DATA:  Abdominal pain.  Nausea and vomiting.  EXAM: ABDOMEN - 1 VIEW  COMPARISON:  10/13/2014.  CT 10/12/2014  FINDINGS: Soft tissue structures are unremarkable. Interim improvement of small bowel distention. Colonic gas pattern is normal . No pneumatosis. No free air. No acute bony abnormality.  IMPRESSION: Interim improvement of small bowel distention consistent with improving small partial small bowel obstruction .   Electronically Signed   By: Maisie Fus  Register   On: 10/14/2014 10:29   Ct Abdomen Pelvis W  Contrast  10/12/2014   CLINICAL DATA:  Abdominal pain, vomiting, possible small bowel obstruction.  EXAM: CT ABDOMEN AND PELVIS WITH CONTRAST  TECHNIQUE: Multidetector CT imaging of the abdomen and pelvis was performed using the standard protocol following bolus administration of intravenous contrast.  CONTRAST:  OMNIPAQUE IOHEXOL 300 MG/ML  SOLN  COMPARISON:  X-ray of the abdomen same day  FINDINGS: Lung bases are unremarkable. Sagittal images of the spine shows mild degenerative changes lower thoracic spine. Enhanced liver is unremarkable. There is no evidence of gastric outlet obstruction. Enhanced pancreas, spleen and adrenal glands are unremarkable. No aortic aneurysm. Enhanced kidneys are symmetrical in size. No hydronephrosis or hydroureter. There is nonobstructive calculus midpole of the left kidney measures 2.2 mm. There is a cyst in midpole posterior aspect of the left kidney measures 1.4 cm.  Delayed renal images shows bilateral renal symmetrical excretion. Bilateral visualized proximal ureter is unremarkable.  There are distended small bowel loops with fluid in right abdomen. Fluid distended small bowel with and air-fluid levels are noted in mid lower abdomen.  There is transition point in caliber of small bowel in axial image 42. Findings are consistent with small bowel obstruction. The terminal ileum is small caliber decompressed. This is a high grade obstruction.  In axial images 29 and 30 there is thickening of small bowel wall in right abdomen up to 8 mm. Findings are highly suspicious for small bowel edema or inflammation or  small bowel wall. There is no definite evidence of intramural gas to suggest acute small bowel ischemia.  The right colon is decompressed. Moderate stool noted in transverse colon. Some stool noted in sigmoid colon and rectum. Nonspecific mild thickening of urinary bladder wall. Small right inguinal canal hernia containing fat measures 1.5 cm without evidence of acute  complication. There is no adenopathy. No ascites or free air.  There is no pericecal inflammation. Normal appendix is noted in axial image 48.  Prostate gland and seminal vesicles are unremarkable.  Mild degenerative changes bilateral hip joints with mild narrowing of superior hip joint space.  IMPRESSION: 1. There are distended small bowel loops with air-fluid levels. There is transition point in caliber of small bowel in axial image 42. Findings are consistent with high-grade small bowel obstruction. 2. There is segmental thickening of small bowel wall in right abdomen please see axial image 29. Findings are consistent with small bowel wall edema or inflammation. No definite evidence of intramural gas to suggest acute small bowel ischemia. Clinical correlation is necessary. 3. Normal appendix.  No pericecal inflammation. 4. Nonspecific mild thickening of urinary bladder wall.   Electronically Signed   By: Natasha MeadLiviu  Pop M.D.   On: 10/12/2014 20:27   Dg Abd 2 Views  10/13/2014   CLINICAL DATA:  69 year old male with bowel obstruction and vomiting  EXAM: ABDOMEN - 2 VIEW  COMPARISON:  Abdominal radiographs and CT scan 10/12/2014  FINDINGS: Increasing linear atelectasis in the left lower lobe. Slightly improved gaseous distension of small bowel in the upper abdomen. No free air on the upright view.  IMPRESSION: 1. Slightly improved gaseous distension of small bowel on the upper abdomen. 2. Developing left lower lobe atelectasis.   Electronically Signed   By: Malachy MoanHeath  McCullough M.D.   On: 10/13/2014 08:51   Dg Abd 2 Views  10/12/2014   CLINICAL DATA:  Coffee ground emesis and abdominal pain  EXAM: ABDOMEN - 2 VIEW  COMPARISON:  None.  FINDINGS: Scattered large and small bowel gas is noted. Very mild small bowel dilatation is noted. Fecal material is noted throughout the colon. Air is noted within the colon as well. No free air is seen. No abnormal mass or abnormal calcifications are noted. The osseous structures are  within normal limits.  IMPRESSION: Mildly dilated small bowel which may be related to a small-bowel ileus or partial small bowel obstruction.   Electronically Signed   By: Alcide CleverMark  Lukens M.D.   On: 10/12/2014 19:08    Anti-infectives: Anti-infectives    None      Assessment/Plan:  KUB is personally reviewed showing gas in the colon and rectum no obvious bowel obstruction of a persistent nature. Recommend advancing diet at this point to clears. We'll get new films in the morning.  Lattie Hawichard E Juelz Whittenberg, MD, FACS  10/14/2014

## 2014-10-14 NOTE — Care Management Important Message (Signed)
Important Message  Patient Details  Name: Willie George MRN: [id number] Date of Birth: 10/21/1945   Medicare Important Message Given:  Yes-second notification given    Olegario MessierKathy A Allmond 10/14/2014, 1:54 PM

## 2014-10-15 ENCOUNTER — Inpatient Hospital Stay: Payer: Medicare Other

## 2014-10-15 LAB — CBC
HEMATOCRIT: 40.8 % (ref 40.0–52.0)
Hemoglobin: 13.9 g/dL (ref 13.0–18.0)
MCH: 27.7 pg (ref 26.0–34.0)
MCHC: 34.1 g/dL (ref 32.0–36.0)
MCV: 81.3 fL (ref 80.0–100.0)
Platelets: 200 10*3/uL (ref 150–440)
RBC: 5.02 MIL/uL (ref 4.40–5.90)
RDW: 14.3 % (ref 11.5–14.5)
WBC: 7.5 10*3/uL (ref 3.8–10.6)

## 2014-10-15 LAB — POTASSIUM: POTASSIUM: 3.8 mmol/L (ref 3.5–5.1)

## 2014-10-15 LAB — MAGNESIUM: Magnesium: 1.8 mg/dL (ref 1.7–2.4)

## 2014-10-15 MED ORDER — PRAVASTATIN SODIUM 20 MG PO TABS
20.0000 mg | ORAL_TABLET | Freq: Every day | ORAL | Status: DC
  Administered 2014-10-15 – 2014-10-16 (×2): 20 mg via ORAL
  Filled 2014-10-15 (×2): qty 1

## 2014-10-15 MED ORDER — OXCARBAZEPINE 300 MG PO TABS
300.0000 mg | ORAL_TABLET | Freq: Two times a day (BID) | ORAL | Status: DC
  Administered 2014-10-15 – 2014-10-17 (×5): 300 mg via ORAL
  Filled 2014-10-15 (×5): qty 1

## 2014-10-15 MED ORDER — AMLODIPINE BESYLATE 5 MG PO TABS
5.0000 mg | ORAL_TABLET | Freq: Every day | ORAL | Status: DC
  Administered 2014-10-15 – 2014-10-17 (×3): 5 mg via ORAL
  Filled 2014-10-15 (×3): qty 1

## 2014-10-15 MED ORDER — METOPROLOL TARTRATE 25 MG PO TABS
25.0000 mg | ORAL_TABLET | Freq: Two times a day (BID) | ORAL | Status: DC
  Administered 2014-10-15 – 2014-10-16 (×4): 25 mg via ORAL
  Filled 2014-10-15 (×5): qty 1

## 2014-10-15 MED ORDER — CLONIDINE HCL 0.1 MG PO TABS
0.2000 mg | ORAL_TABLET | Freq: Two times a day (BID) | ORAL | Status: DC
  Administered 2014-10-15 – 2014-10-17 (×5): 0.2 mg via ORAL
  Filled 2014-10-15 (×5): qty 2

## 2014-10-15 MED ORDER — FLEET ENEMA 7-19 GM/118ML RE ENEM
1.0000 | ENEMA | Freq: Once | RECTAL | Status: AC
  Administered 2014-10-15: 1 via RECTAL

## 2014-10-15 NOTE — Progress Notes (Signed)
CC abdominal pain Subjective: Patient admitted with abdominal pain and a partial small bowel obstruction. He is passing gas but has not had a bowel movement his abdominal pain is minimal. He has no further nausea or vomiting and has his NG tube out at this point.  Objective: Vital signs in last 24 hours: Temp:  [98.1 F (36.7 C)-98.8 F (37.1 C)] 98.1 F (36.7 C) (07/19 0830) Pulse Rate:  [61-66] 65 (07/19 0830) Resp:  [14-20] 20 (07/19 0830) BP: (158-194)/(93-104) 158/102 mmHg (07/19 0900) SpO2:  [97 %-98 %] 98 % (07/19 0830) Last BM Date:  (Pt can't remember)  Intake/Output from previous day: 07/18 0701 - 07/19 0700 In: 3047 [P.O.:595; I.V.:2247; IV Piggyback:205] Out: -  Intake/Output this shift: Total I/O In: 273 [I.V.:273] Out: -   Physical exam:  Patient awake and alert. Abdomen is soft nondistended min we tympanitic and nontender. As are nontender.  Lab Results: CBC   Recent Labs  10/14/14 0502 10/15/14 0349  WBC 8.6 7.5  HGB 13.7 13.9  HCT 41.3 40.8  PLT 190 200   BMET  Recent Labs  10/13/14 0403 10/14/14 0502 10/15/14 0349  NA 143 140  --   K 3.5 3.3* 3.8  CL 111 110  --   CO2 22 23  --   GLUCOSE 135* 89  --   BUN 17 13  --   CREATININE 0.86 0.81  --   CALCIUM 8.8* 8.7*  --    PT/INR  Recent Labs  10/12/14 1817  LABPROT 16.8*  INR 1.34   ABG No results for input(s): PHART, HCO3 in the last 72 hours.  Invalid input(s): PCO2, PO2  Studies/Results: Dg Abd 1 View  10/14/2014   CLINICAL DATA:  Abdominal pain.  Nausea and vomiting.  EXAM: ABDOMEN - 1 VIEW  COMPARISON:  10/13/2014.  CT 10/12/2014  FINDINGS: Soft tissue structures are unremarkable. Interim improvement of small bowel distention. Colonic gas pattern is normal . No pneumatosis. No free air. No acute bony abnormality.  IMPRESSION: Interim improvement of small bowel distention consistent with improving small partial small bowel obstruction .   Electronically Signed   By: Maisie Fushomas   Register   On: 10/14/2014 10:29   Dg Abd 2 Views  10/15/2014   CLINICAL DATA:  Abdominal pain.  Partial small bowel obstruction.  EXAM: ABDOMEN - 2 VIEW  COMPARISON:  10/14/2014.  CT 10/12/2014.  FINDINGS: Recurrent distention of small bowel loops noted. Colonic gas pattern is normal. No free air. Calcifications pelvis consistent with phleboliths. No acute bony abnormality. Subsegmental atelectasis both lung bases.  IMPRESSION: Interim recurrent small bowel distention. Colonic gas pattern appears normal. Findings are consistent with partial small bowel obstruction or adynamic ileus. No free air.   Electronically Signed   By: Maisie Fushomas  Register   On: 10/15/2014 07:49    Anti-infectives: Anti-infectives    None      Assessment/Plan:  Films are personally reviewed he has gas throughout his colon and considerable stool burden in his: Small bowel obstruction is less likely in my opinion.  I suspect this is an ileus as opposed of bowel obstruction I discussed this with prime doc I recommend advancing diet at this point be as he is not vomiting and I suspect this is a mild ileus. Will order fleets enema at this time.  Lattie Hawichard E Ivann Trimarco, MD, FACS  10/15/2014

## 2014-10-15 NOTE — Progress Notes (Signed)
[facility] [place] [facility]   PATIENT NAME: Willie EchevariaJames George    MR#:  [id number]  DATE OF BIRTH:  08/27/1945  SUBJECTIVE:  CHIEF COMPLAINT:   Chief Complaint  Patient presents with  . Hematemesis   Patient here with abdominal pain, nausea, vomiting and noted to have a partial small bowel obstruction. KUB this morning still showing partial small bowel obstruction/adynamic ileus. Patient has some abdominal pain but no nausea, vomiting and the abdomen is not significantly distended.  REVIEW OF SYSTEMS:    Review of Systems  Constitutional: Negative for fever and chills.  HENT: Negative for congestion and tinnitus.   Eyes: Negative for blurred vision and double vision.  Respiratory: Negative for cough, shortness of breath and wheezing.   Cardiovascular: Negative for chest pain, orthopnea and PND.  Gastrointestinal: Positive for abdominal pain. Negative for nausea, vomiting and diarrhea.  Genitourinary: Negative for dysuria and hematuria.  Neurological: Negative for dizziness, sensory change and focal weakness.  All other systems reviewed and are negative.   Nutrition: Clear liquids Tolerating Diet: Yes  DRUG ALLERGIES:  No Known Allergies  VITALS:  Blood pressure 158/102, pulse 65, temperature 98.1 F (36.7 C), temperature source Oral, resp. rate 20, height 5\' 5"  (1.651 m), weight 83.054 kg (183 lb 1.6 oz), SpO2 98 %.  PHYSICAL EXAMINATION:   Physical Exam  GENERAL:  69 y.o.-year-old patient lying in the bed with no acute distress.  EYES: Pupils equal, round, reactive to light and accommodation. No scleral icterus. Extraocular muscles intact.  HEENT: Head atraumatic, normocephalic. Oropharynx and nasopharynx clear.  NECK:  Supple, no jugular venous distention. No thyroid enlargement, no tenderness.  LUNGS: Normal breath sounds bilaterally, no wheezing, rales, rhonchi. No use of accessory muscles of respiration.  CARDIOVASCULAR: S1, S2 RRR. No  murmurs, rubs, or gallops.  ABDOMEN: Soft, slightly distended, + bowel sounds, no rebound/rigidity. No organomegaly appreciated. EXTREMITIES: No cyanosis, clubbing or edema b/l.    NEUROLOGIC: Cranial nerves II through XII are intact. No focal Motor or sensory deficits b/l. Globally weak  PSYCHIATRIC: The patient is alert and oriented x 3.  SKIN: No obvious rash, lesion, or ulcer.    LABORATORY PANEL:   CBC  Recent Labs Lab 10/15/14 0349  WBC 7.5  HGB 13.9  HCT 40.8  PLT 200   ------------------------------------------------------------------------------------------------------------------  Chemistries   Recent Labs Lab 10/12/14 1817  10/14/14 0502 10/15/14 0349  NA 142  < > 140  --   K 3.7  < > 3.3* 3.8  CL 105  < > 110  --   CO2 24  < > 23  --   GLUCOSE 113*  < > 89  --   BUN 20  < > 13  --   CREATININE 0.91  < > 0.81  --   CALCIUM 9.7  < > 8.7*  --   MG  --   --   --  1.8  AST 61*  --   --   --   ALT 44  --   --   --   ALKPHOS 104  --   --   --   BILITOT 0.5  --   --   --   < > = values in this interval not displayed. ------------------------------------------------------------------------------------------------------------------  Cardiac Enzymes  Recent Labs Lab 10/12/14 1817  TROPONINI 0.03   ------------------------------------------------------------------------------------------------------------------  RADIOLOGY:  Dg Abd 1 View  10/14/2014   CLINICAL DATA:  Abdominal pain.  Nausea and vomiting.  EXAM: ABDOMEN - 1 VIEW  COMPARISON:  10/13/2014.  CT 10/12/2014  FINDINGS: Soft tissue structures are unremarkable. Interim improvement of small bowel distention. Colonic gas pattern is normal . No pneumatosis. No free air. No acute bony abnormality.  IMPRESSION: Interim improvement of small bowel distention consistent with improving small partial small bowel obstruction .   Electronically Signed   By: Maisie Fus  Register   On: 10/14/2014 10:29   Dg Abd 2  Views  10/15/2014   CLINICAL DATA:  Abdominal pain.  Partial small bowel obstruction.  EXAM: ABDOMEN - 2 VIEW  COMPARISON:  10/14/2014.  CT 10/12/2014.  FINDINGS: Recurrent distention of small bowel loops noted. Colonic gas pattern is normal. No free air. Calcifications pelvis consistent with phleboliths. No acute bony abnormality. Subsegmental atelectasis both lung bases.  IMPRESSION: Interim recurrent small bowel distention. Colonic gas pattern appears normal. Findings are consistent with partial small bowel obstruction or adynamic ileus. No free air.   Electronically Signed   By: Maisie Fus  Register   On: 10/15/2014 07:49     ASSESSMENT AND PLAN:   69 year old male with past medical history of dementia, history of previous CVA, hypertension, hyperlipidemia, history of seizures who presented to the hospital due to abdominal pain, nausea, vomiting which was coffee-ground in color.  #1 small bowel obstruction-this is likely the cause of patient's nausea vomiting. Patient CT scan on admission was consistent with this. -Patient has been seen by surgery and no plans on acute surgical intervention. NG tube was placed 7/17 but removed by pt. As he could not tolerate it.  KUB this a.m. shows persistent partial small bowel obstruction/adynamic ileus. - cont. supportive care with IV fluids, antiemetics, pain meds. - tolerating clear liquids well.  -Await further surgical input. Discussed with Dr. Excell Seltzer.  #2 GI bleed-patient presented with coffee-ground emesis. -Hemoglobin stable, no evidence of acute bleeding over the past 2 days. -Continue Protonix, continue to hold Xarelto.  #3 history of seizures- take PO now and will resume Trileptal.   #4 hypertension-taking PO now and will resume, Norvasc, Metoprolol, Clonidine.    #5 history of previous CVA-hold Xarelto given the GI bleed.  #6 hyperlipidemia-continue Pravachol.   All the records are reviewed and case discussed with Care Management/Social  Workerr. Management plans discussed with the patient, family and they are in agreement.  CODE STATUS: DO NOT RESUSCITATE  DVT Prophylaxis: Teds and SCDs  TOTAL TIME TAKING CARE OF THIS PATIENT: 30 minutes.   POSSIBLE D/C IN 1-2 DAYS, DEPENDING ON CLINICAL CONDITION.   Houston Siren M.D on 10/15/2014 at 10:57 AM  Between 7am to 6pm - Pager - [phone]  After 6pm go to [email or web address] - password [email or web address] [facility]  [facility]  Office  [phone]  CC: Primary care physician; No primary care provider on file.

## 2014-10-16 MED ORDER — RIVAROXABAN 20 MG PO TABS
20.0000 mg | ORAL_TABLET | Freq: Every day | ORAL | Status: DC
  Administered 2014-10-16 – 2014-10-17 (×2): 20 mg via ORAL
  Filled 2014-10-16 (×2): qty 1

## 2014-10-16 MED ORDER — PANTOPRAZOLE SODIUM 40 MG PO TBEC
40.0000 mg | DELAYED_RELEASE_TABLET | Freq: Two times a day (BID) | ORAL | Status: DC
  Administered 2014-10-16 – 2014-10-17 (×3): 40 mg via ORAL
  Filled 2014-10-16 (×3): qty 1

## 2014-10-16 NOTE — Progress Notes (Signed)
CC: Abdominal pain Subjective: Patient feels better today he's had no nausea vomiting since Monday not had an NG tube in place for several days. His abdominal pain is present but minimal today he had a bowel movement and is passing gas.  Objective: Vital signs in last 24 hours: Temp:  [98.2 F (36.8 C)] 98.2 F (36.8 C) (07/20 0700) Pulse Rate:  [62-68] 62 (07/20 0700) Resp:  [14-16] 16 (07/20 0700) BP: (139-158)/(79-97) 142/79 mmHg (07/20 0700) SpO2:  [95 %-97 %] 97 % (07/20 0700) Last BM Date: 10/15/14  Intake/Output from previous day: 07/19 0701 - 07/20 0700 In: 2904 [P.O.:600; I.V.:2304] Out: -  Intake/Output this shift: Total I/O In: 257.3 [I.V.:257.3] Out: -   Physical exam:  Abdomen is soft and only minimally distended minimally tympanitic and nontender  Extremities are without tenderness in the calves.  Patient is awake and alert    Lab Results: CBC   Recent Labs  10/14/14 0502 10/15/14 0349  WBC 8.6 7.5  HGB 13.7 13.9  HCT 41.3 40.8  PLT 190 200   BMET  Recent Labs  10/14/14 0502 10/15/14 0349  NA 140  --   K 3.3* 3.8  CL 110  --   CO2 23  --   GLUCOSE 89  --   BUN 13  --   CREATININE 0.81  --   CALCIUM 8.7*  --    PT/INR No results for input(s): LABPROT, INR in the last 72 hours. ABG No results for input(s): PHART, HCO3 in the last 72 hours.  Invalid input(s): PCO2, PO2  Studies/Results: Dg Abd 2 Views  10/15/2014   CLINICAL DATA:  Abdominal pain.  Partial small bowel obstruction.  EXAM: ABDOMEN - 2 VIEW  COMPARISON:  10/14/2014.  CT 10/12/2014.  FINDINGS: Recurrent distention of small bowel loops noted. Colonic gas pattern is normal. No free air. Calcifications pelvis consistent with phleboliths. No acute bony abnormality. Subsegmental atelectasis both lung bases.  IMPRESSION: Interim recurrent small bowel distention. Colonic gas pattern appears normal. Findings are consistent with partial small bowel obstruction or adynamic ileus. No  free air.   Electronically Signed   By: Maisie Fushomas  Register   On: 10/15/2014 07:49    Anti-infectives: Anti-infectives    None      Assessment/Plan:  Ileus versus SBO is basically resolved at this point he is tolerating a diet and will be advanced to a soft diet. He is not showing signs of obstruction and could be advanced to a regular diet tomorrow. I see no additional need for any imaging unless he recedes. I discussed this with prime doc.  Lattie Hawichard E Brazil Voytko, MD, FACS  10/16/2014

## 2014-10-16 NOTE — Care Management Important Message (Signed)
Important Message  Patient Details  Name: Willie George MRN: [id number] Date of Birth: 01/10/1946   Medicare Important Message Given:  Yes-third notification given    Olegario MessierKathy A Allmond 10/16/2014, 10:20 AM

## 2014-10-16 NOTE — Progress Notes (Signed)
[facility] - [place] at [facility]   PATIENT NAME: Willie George    MR#:  [id number]  DATE OF BIRTH:  1946-01-21  SUBJECTIVE:  CHIEF COMPLAINT:   Chief Complaint  Patient presents with  . Hematemesis   Patient here with abdominal pain, nausea, vomiting and noted to have a partial small bowel obstruction. Further nausea or vomiting overnight. Still has some vague abdominal pain. Abdomen is not distended.  REVIEW OF SYSTEMS:    Review of Systems  Constitutional: Negative for fever and chills.  HENT: Negative for congestion and tinnitus.   Eyes: Negative for blurred vision and double vision.  Respiratory: Negative for cough, shortness of breath and wheezing.   Cardiovascular: Negative for chest pain, orthopnea and PND.  Gastrointestinal: Positive for abdominal pain. Negative for nausea, vomiting and diarrhea.  Genitourinary: Negative for dysuria and hematuria.  Neurological: Negative for dizziness, sensory change and focal weakness.  All other systems reviewed and are negative.  Nutrition: Clear liquids Tolerating Diet: Yes  DRUG ALLERGIES:  No Known Allergies  VITALS:  Blood pressure 142/79, pulse 62, temperature 98.2 F (36.8 C), temperature source Oral, resp. rate 16, height  (1.651 m), weight 83.054 kg (183 lb 1.6 oz), SpO2 97 %.  PHYSICAL EXAMINATION:   Physical Exam  GENERAL:  69 y.o.-year-old patient lying in the bed with no acute distress.  EYES: Pupils equal, round, reactive to light and accommodation. No scleral icterus. Extraocular muscles intact.  HEENT: Head atraumatic, normocephalic. Oropharynx and nasopharynx clear.  NECK:  Supple, no jugular venous distention. No thyroid enlargement, no tenderness.  LUNGS: Normal breath sounds bilaterally, no wheezing, rales, rhonchi. No use of accessory muscles of respiration.  CARDIOVASCULAR: S1, S2 RRR. No murmurs, rubs, or gallops.  ABDOMEN: Soft, slightly distended, + bowel sounds, no  rebound/rigidity. No organomegaly appreciated. EXTREMITIES: No cyanosis, clubbing or edema b/l.    NEUROLOGIC: Cranial nerves II through XII are intact. No focal Motor or sensory deficits b/l. Globally weak  PSYCHIATRIC: The patient is alert and oriented x 3.  SKIN: No obvious rash, lesion, or ulcer.    LABORATORY PANEL:   CBC  Recent Labs Lab 10/15/14 0349  WBC 7.5  HGB 13.9  HCT 40.8  PLT 200   ------------------------------------------------------------------------------------------------------------------  Chemistries   Recent Labs Lab 10/12/14 1817  10/14/14 0502 10/15/14 0349  NA 142  < > 140  --   K 3.7  < > 3.3* 3.8  CL 105  < > 110  --   CO2 24  < > 23  --   GLUCOSE 113*  < > 89  --   BUN 20  < > 13  --   CREATININE 0.91  < > 0.81  --   CALCIUM 9.7  < > 8.7*  --   MG  --   --   --  1.8  AST 61*  --   --   --   ALT 44  --   --   --   ALKPHOS 104  --   --   --   BILITOT 0.5  --   --   --   < > = values in this interval not displayed. ------------------------------------------------------------------------------------------------------------------  Cardiac Enzymes  Recent Labs Lab 10/12/14 1817  TROPONINI 0.03   ------------------------------------------------------------------------------------------------------------------  RADIOLOGY:  Dg Abd 2 Views  10/15/2014   CLINICAL DATA:  Abdominal pain.  Partial small bowel obstruction.  EXAM: ABDOMEN - 2 VIEW  COMPARISON:  10/14/2014.  CT  10/12/2014.  FINDINGS: Recurrent distention of small bowel loops noted. Colonic gas pattern is normal. No free air. Calcifications pelvis consistent with phleboliths. No acute bony abnormality. Subsegmental atelectasis both lung bases.  IMPRESSION: Interim recurrent small bowel distention. Colonic gas pattern appears normal. Findings are consistent with partial small bowel obstruction or adynamic ileus. No free air.   Electronically Signed   By: Maisie Fushomas  Register   On:  10/15/2014 07:49     ASSESSMENT AND PLAN:   [age] year old male with past medical history of dementia, history of previous CVA, hypertension, hyperlipidemia, history of seizures who presented to the hospital due to abdominal pain, nausea, vomiting which was coffee-ground in color.  #1 small bowel obstruction-this is likely the cause of patient's nausea vomiting. Patient CT scan on admission was consistent with this. -Patient has been seen by surgery and no plans on acute surgical intervention. NG tube was placed 7/17 but removed by pt. As he could not tolerate it.   - cont. supportive care with IV fluids, antiemetics, pain meds. - tolerating full liquids and will advance to soft diet as per surgery.    -possible   #2 GI bleed-patient presented with coffee-ground emesis. -Hemoglobin stable, no evidence of acute bleeding over the past 3 days. -Continue Protonix and switch to Oral, resume Xarelto.   #3 history of seizures- cont. Trileptal.  - no seizures  #4 hypertension- cont. Norvasc, Metoprolol, Clonidine.  - hemodynamically stable.    #5 history of previous CVA-resume Xarelto today as no bleeding.   #6 hyperlipidemia-continue Pravachol.   All the records are reviewed and case discussed with Care Management/Social Workerr. Management plans discussed with the patient, family and they are in agreement.  CODE STATUS: DO NOT RESUSCITATE  DVT Prophylaxis: Teds and SCDs  TOTAL TIME TAKING CARE OF THIS PATIENT: 30 minutes.   POSSIBLE D/C tomorrow a.m. If tolerating PO well and no N/V.     Houston SirenSAINANI,VIVEK J M.D on 10/16/2014 at 12:32 PM  Between 7am to 6pm - Pager - [phone]  After 6pm go to [email or web address] - password [email or web address] [facility]  [facility]  Office  [phone]  CC: Primary care physician; No primary care provider on file.

## 2014-10-17 MED ORDER — DOCUSATE SODIUM 100 MG PO CAPS: 100.0000 mg | ORAL_CAPSULE | Freq: Two times a day (BID) | ORAL | Status: AC | PRN

## 2014-10-17 MED ORDER — SENNA 8.6 MG PO TABS: 1.0000 | ORAL_TABLET | Freq: Every day | ORAL | Status: AC | PRN

## 2014-10-17 NOTE — Discharge Instructions (Signed)
°  DIET:  °Cardiac diet ° °DISCHARGE CONDITION:  °Stable ° °ACTIVITY:  °Activity as tolerated ° °OXYGEN:  °Home Oxygen: No. °  °Oxygen Delivery: room air ° °DISCHARGE LOCATION:  °nursing home  ° °If you experience worsening of your admission symptoms, develop shortness of breath, life threatening emergency, suicidal or homicidal thoughts you must seek medical attention immediately by calling 911 or calling your MD immediately  if symptoms less severe. ° °You Must read complete instructions/literature along with all the possible adverse reactions/side effects for all the Medicines you take and that have been prescribed to you. Take any new Medicines after you have completely understood and accpet all the possible adverse reactions/side effects.  ° °Please note ° °You were cared for by a hospitalist during your hospital stay. If you have any questions about your discharge medications or the care you received while you were in the hospital after you are discharged, you can call the unit and asked to speak with the hospitalist on call if the hospitalist that took care of you is not available. Once you are discharged, your primary care physician will handle any further medical issues. Please note that NO REFILLS for any discharge medications will be authorized once you are discharged, as it is imperative that you return to your primary care physician (or establish a relationship with a primary care physician if you do not have one) for your aftercare needs so that they can reassess your need for medications and monitor your lab values. ° ° ° °

## 2014-10-17 NOTE — Progress Notes (Signed)
CC: Abdominal pain Subjective: Patient is admitted with partial small bowel obstruction which has resolved he is passing gas and having bowel movements he is tolerating a regular diet but has ongoing minimal abdominal pain.  Objective: Vital signs in last 24 hours: Temp:  [98.2 F (36.8 C)-99.1 F (37.3 C)] 98.2 F (36.8 C) (07/21 0738) Pulse Rate:  [57-67] 57 (07/21 0738) Resp:  [16-17] 17 (07/21 0738) BP: (131-170)/(87-93) 164/89 mmHg (07/21 0738) SpO2:  [92 %-98 %] 94 % (07/21 0738) Last BM Date: 10/15/14  Intake/Output from previous day: 07/20 0701 - 07/21 0700 In: 2336.3 [P.O.:540; I.V.:1796.3] Out: -  Intake/Output this shift: Total I/O In: 700 [P.O.:120; I.V.:580] Out: -   Physical exam:  Abdomen is soft nondistended nontympanitic and nontender with the exception of some very mild periumbilical tenderness with no peritoneal signs or mass. Calves are nontender. She appears comfortable.  Lab Results: CBC   Recent Labs  10/15/14 0349  WBC 7.5  HGB 13.9  HCT 40.8  PLT 200   BMET  Recent Labs  10/15/14 0349  K 3.8   PT/INR No results for input(s): LABPROT, INR in the last 72 hours. ABG No results for input(s): PHART, HCO3 in the last 72 hours.  Invalid input(s): PCO2, PO2  Studies/Results: No results found.  Anti-infectives: Anti-infectives    None      Assessment/Plan:  Patient doing well small bowel obstruction or ileus has resolved. His tolerating a regular diet and his exam remains fairly benign. I am in agreement with discharge at this time ankle follow-up with his primary care physician as needed.  Lattie Haw, MD, FACS  10/17/2014

## 2014-10-17 NOTE — Clinical Social Work Note (Signed)
Patient to discharge to return to [facility] today. Patient is in agreement and Rosey Bath at [facility] is aware. Discharge summary sent. Nurse to call report. Patient to transport via EMS for seizure precautions, dementia. York Spaniel MSW,LCSW [phone]

## 2014-10-17 NOTE — Progress Notes (Signed)
Called report to [employer]. Iv was removed and pt left via EMS.

## 2014-10-17 NOTE — Discharge Summary (Signed)
[facility] - [place] at [facility]   PATIENT NAME: Willie George    MR#:  [id number]  DATE OF BIRTH:  May 01, 1945  DATE OF ADMISSION:  10/12/2014 ADMITTING PHYSICIAN: Wyatt Haste, MD  DATE OF DISCHARGE: 10/17/2014  PRIMARY CARE PHYSICIAN: Derwood Kaplan, MD    ADMISSION DIAGNOSIS:  Bowel obstruction [K56.60] Small bowel obstruction [K56.69] Abdominal pain, acute [R10.0]  DISCHARGE DIAGNOSIS:  Active Problems:   Small bowel obstruction   Abdominal distension   Abdominal pain, acute   SBO (small bowel obstruction)   SECONDARY DIAGNOSIS:   Past Medical History  Diagnosis Date  . Hypertension   . Stroke   . Dementia     HOSPITAL COURSE:   69 year old male with past medical history of dementia, history of previous CVA, hypertension, hyperlipidemia, history of seizures who presented to the hospital due to abdominal pain, nausea, vomiting which was coffee-ground in color.  #1 small bowel obstruction-this was a clinical diagnosis on admission given patient's x-ray findings and persistent nausea and vomiting. -Patient was treated conservatively with IV fluids, antiemetics, laxatives. NG tube was attempted but patient pulled it out quickly. A surgical consult was obtained who agreed with the conservative management. -Patient's ileus/partial small bowel obstruction has improved and is now tolerating a soft diet without any nausea vomiting or worsening abdominal distention or pain. He's therefore being discharged to skilled nursing facility. Patient is being discharged on some laxatives as needed.  #2 GI bleed-patient presented with one episode coffee-ground emesis. -His hemoglobin remained stable and he had no further bleeding while in the hospital. A GI consult was not obtained as as patient had no overt GI bleeding. He was maintained on Protonix and is without Xarelto has been restarted and has had no further bleeding.  #3 history of seizures- while  patient was nothing by mouth he was maintained on IV Keppra but has been switched back to his oral Trileptal   #4 hypertension- cont. Norvasc, Metoprolol, Clonidine.   #5 history of previous CVA-continue Xarelto as GI bleed has been ruled out.  #6 hyperlipidemia-continue Pravachol.  Patient is clinically doing well and therefore being discharged back to his skilled nursing facility for ongoing care.  DISCHARGE CONDITIONS:   Stable  CONSULTS OBTAINED:  Treatment Team:  Wyatt Haste, MD Ida Rogue, MD  DRUG ALLERGIES:  No Known Allergies  DISCHARGE MEDICATIONS:   Current Discharge Medication List    START taking these medications   Details  docusate sodium (COLACE) 100 MG capsule Take 1 capsule (100 mg total) by mouth 2 (two) times daily as needed for mild constipation. Qty: 10 capsule, Refills: 0    senna (SENOKOT) 8.6 MG TABS tablet Take 1 tablet (8.6 mg total) by mouth daily as needed for mild constipation. Qty: 120 each, Refills: 0      CONTINUE these medications which have NOT CHANGED   Details  acetaminophen (TYLENOL) 325 MG tablet Take 650 mg by mouth every 4 (four) hours as needed for mild pain or moderate pain.    amLODipine (NORVASC) 5 MG tablet Take 5 mg by mouth daily.    Cholecalciferol (VITAMIN D3) 50000 UNITS CAPS Take 50,000 Units by mouth every 30 (thirty) days. Take on 28th    cloNIDine (CATAPRES) 0.2 MG tablet Take 0.2 mg by mouth 2 (two) times daily.    lactulose, encephalopathy, (CHRONULAC) 10 GM/15ML SOLN Take 20 g by mouth at bedtime.    loratadine (CLARITIN) 10 MG tablet Take 10 mg  by mouth every morning.    magnesium hydroxide (MILK OF MAGNESIA) 400 MG/5ML suspension Take 30 mLs by mouth daily as needed for mild constipation or moderate constipation.    metoprolol tartrate (LOPRESSOR) 25 MG tablet Take 25 mg by mouth 2 (two) times daily.    Oxcarbazepine (TRILEPTAL) 300 MG tablet Take 300 mg by mouth 2 (two) times daily.     pravastatin (PRAVACHOL) 20 MG tablet Take 20 mg by mouth at bedtime.    Pseudoephedrine-Acetaminophen (CEPACOL SORE THROAT PO) Take 1 lozenge by mouth every 2 (two) hours as needed (for sore throat.).    rivaroxaban (XARELTO) 20 MG TABS tablet Take 20 mg by mouth daily.         DISCHARGE INSTRUCTIONS:   DIET:  Cardiac diet  DISCHARGE CONDITION:  Stable  ACTIVITY:  Activity as tolerated  OXYGEN:  Home Oxygen: No.   Oxygen Delivery: room air  DISCHARGE LOCATION:  nursing home   If you experience worsening of your admission symptoms, develop shortness of breath, life threatening emergency, suicidal or homicidal thoughts you must seek medical attention immediately by calling 911 or calling your MD immediately  if symptoms less severe.  You Must read complete instructions/literature along with all the possible adverse reactions/side effects for all the Medicines you take and that have been prescribed to you. Take any new Medicines after you have completely understood and accpet all the possible adverse reactions/side effects.   Please note  You were cared for by a hospitalist during your hospital stay. If you have any questions about your discharge medications or the care you received while you were in the hospital after you are discharged, you can call the unit and asked to speak with the hospitalist on call if the hospitalist that took care of you is not available. Once you are discharged, your primary care physician will handle any further medical issues. Please note that NO REFILLS for any discharge medications will be authorized once you are discharged, as it is imperative that you return to your primary care physician (or establish a relationship with a primary care physician if you do not have one) for your aftercare needs so that they can reassess your need for medications and monitor your lab values.     Today   No nausea, vomiting. Abdominal pain resolved. Tolerating  regular diet now.  Had a BM yesterday.   VITAL SIGNS:  Blood pressure 164/89, pulse 57, temperature 98.2 F (36.8 C), temperature source Oral, resp. rate 17, height 5\' 5"  (1.651 m), weight 83.054 kg (183 lb 1.6 oz), SpO2 94 %.  I/O:    Intake/Output Summary (Last 24 hours) at 10/17/14 1029 Last data filed at 10/17/14 0745  Gross per 24 hour  Intake   2899 ml  Output      0 ml  Net   2899 ml    PHYSICAL EXAMINATION:  GENERAL:  69 y.o.-year-old patient lying in the bed with no acute distress.  EYES: Pupils equal, round, reactive to light and accommodation. No scleral icterus. Extraocular muscles intact.  HEENT: Head atraumatic, normocephalic. Oropharynx and nasopharynx clear.  NECK:  Supple, no jugular venous distention. No thyroid enlargement, no tenderness.  LUNGS: Normal breath sounds bilaterally, no wheezing, rales,rhonchi. No use of accessory muscles of respiration.  CARDIOVASCULAR: S1, S2 RRR. No murmurs, rubs, or gallops.  ABDOMEN: Soft, non-tender, slightly distended. Bowel sounds present. No organomegaly or mass.  EXTREMITIES: No pedal edema, cyanosis, or clubbing.  NEUROLOGIC: Cranial nerves II through  XII are intact. No focal motor or sensory defecits b/l.  PSYCHIATRIC: The patient is alert and oriented x 3. Good affect.  SKIN: No obvious rash, lesion, or ulcer.   DATA REVIEW:   CBC  Recent Labs Lab 10/15/14 0349  WBC 7.5  HGB 13.9  HCT 40.8  PLT 200    Chemistries   Recent Labs Lab 10/12/14 1817  10/14/14 0502 10/15/14 0349  NA 142  < > 140  --   K 3.7  < > 3.3* 3.8  CL 105  < > 110  --   CO2 24  < > 23  --   GLUCOSE 113*  < > 89  --   BUN 20  < > 13  --   CREATININE 0.91  < > 0.81  --   CALCIUM 9.7  < > 8.7*  --   MG  --   --   --  1.8  AST 61*  --   --   --   ALT 44  --   --   --   ALKPHOS 104  --   --   --   BILITOT 0.5  --   --   --   < > = values in this interval not displayed.  Cardiac Enzymes  Recent Labs Lab 10/12/14 1817   TROPONINI 0.03    Microbiology Results  Results for orders placed or performed during the hospital encounter of 10/12/14  MRSA PCR Screening     Status: None   Collection Time: 10/13/14 12:53 PM  Result Value Ref Range Status   MRSA by PCR NEGATIVE NEGATIVE Final    Comment:        The GeneXpert MRSA Assay (FDA approved for NASAL specimens only), is one component of a comprehensive MRSA colonization surveillance program. It is not intended to diagnose MRSA infection nor to guide or monitor treatment for MRSA infections.     RADIOLOGY:  No results found.    Management plans discussed with the patient, family and they are in agreement.  CODE STATUS:     Code Status Orders        Start     Ordered   10/12/14 2233  Do not attempt resuscitation (DNR)   Continuous    Question Answer Comment  In the event of cardiac or respiratory ARREST Do not call a "code blue"   In the event of cardiac or respiratory ARREST Do not perform Intubation, CPR, defibrillation or ACLS   In the event of cardiac or respiratory ARREST Use medication by any route, position, wound care, and other measures to relive pain and suffering. May use oxygen, suction and manual treatment of airway obstruction as needed for comfort.      10/12/14 2233    Advance Directive Documentation        Most Recent Value   Type of Advance Directive  Out of facility DNR (pink MOST or yellow form)   Pre-existing out of facility DNR order (yellow form or pink MOST form)  Yellow form placed in chart (order not valid for inpatient use)   "MOST" Form in Place?        TOTAL TIME TAKING CARE OF THIS PATIENT: 40 minutes.    Houston Siren M.D on 10/17/2014 at 10:29 AM  Between 7am to 6pm - Pager - [phone]  After 6pm go to [email or web address] - password [email or web address] [facility]  [place] [facility]  Office  [phone]  CC: Primary care physician; Maryellen Pile,  Serita Sheller, MD

## 2015-12-18 IMAGING — CT CT ABD-PELV W/ CM
1 of 3 series · 13 of 32 positions shown, 18 images · IV contrast (omnipaque)
Comparison: X-ray of the abdomen same day

CLINICAL DATA: Abdominal pain, vomiting, possible small bowel
obstruction.

EXAM:
CT ABDOMEN AND PELVIS WITH CONTRAST
TECHNIQUE: Multidetector CT imaging of the abdomen and pelvis was performed
using the standard protocol following bolus administration of
intravenous contrast.
CONTRAST:  100mL OMNIPAQUE IOHEXOL 300 MG/ML  SOLN

[Series 2: routine abd pel with · axial · 0.71mm/px · z∈[-475,-50]mm · 13 of 95 slices shown, 18 images]
[im 5/95  soft-tissue]
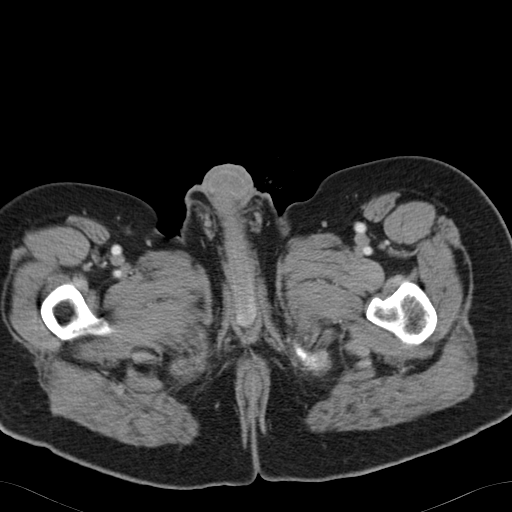
[im 5/95  bone]
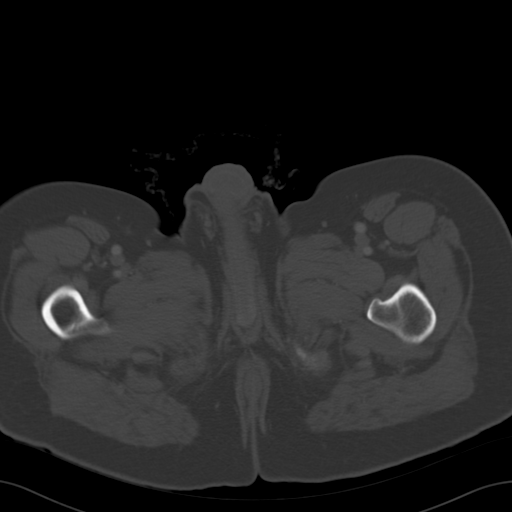
[im 15/95  soft-tissue]
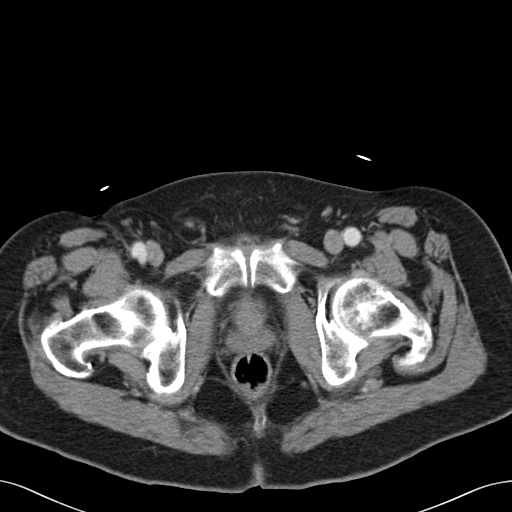
[im 20/95  soft-tissue]
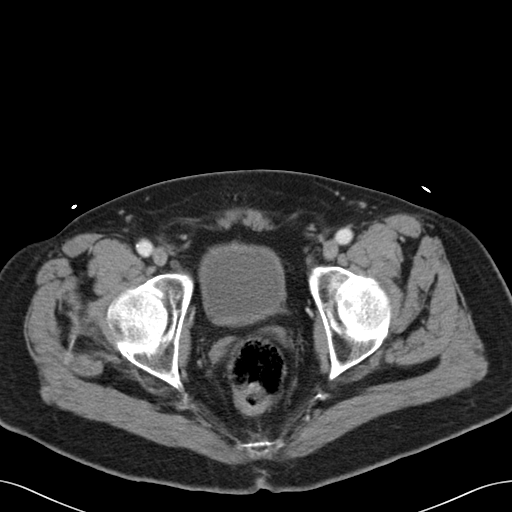
[im 30/95  soft-tissue]
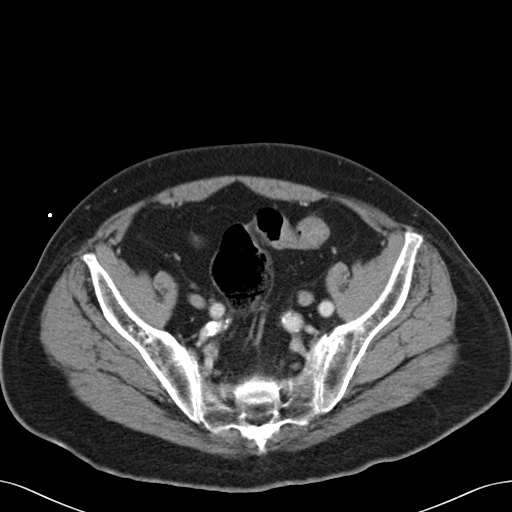
[im 35/95  soft-tissue]
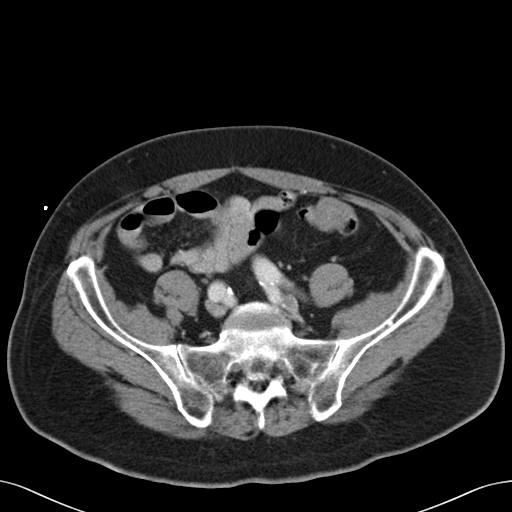
[im 45/95  soft-tissue]
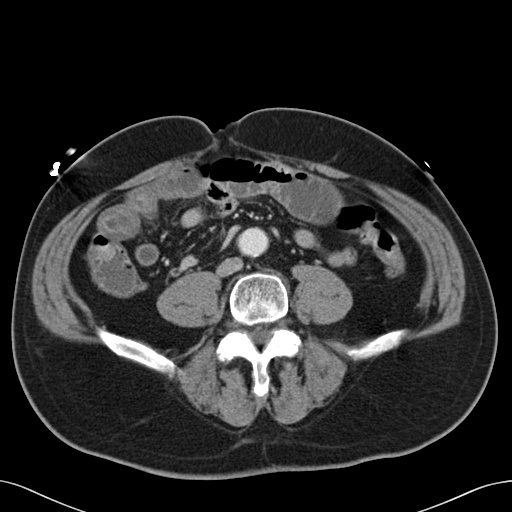
[im 50/95  soft-tissue]
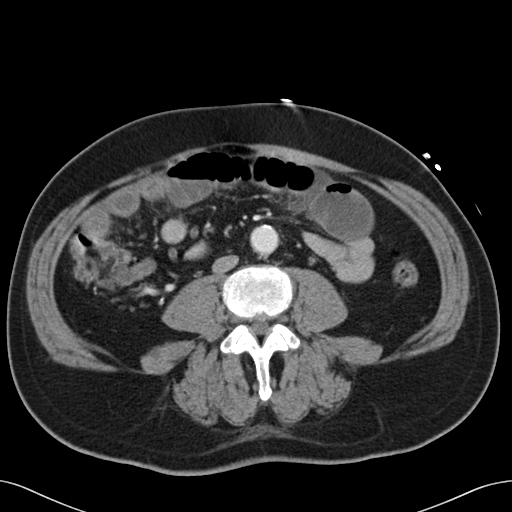
[im 60/95  soft-tissue]
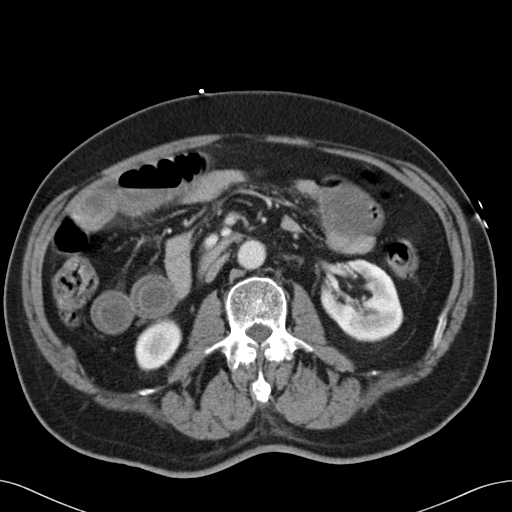
[im 65/95  soft-tissue]
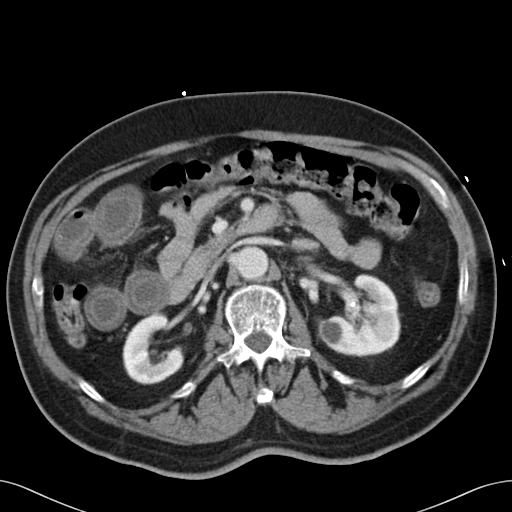
[im 65/95  bone]
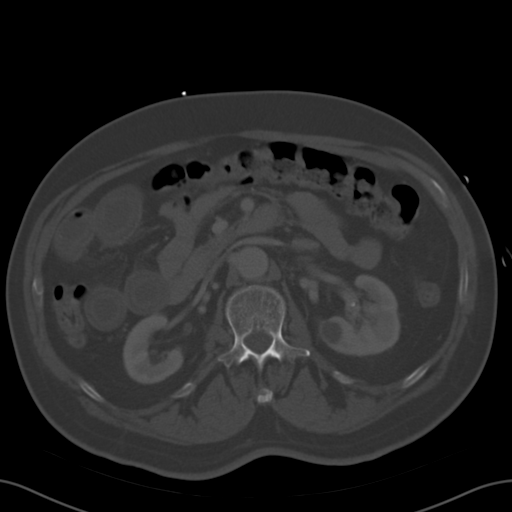
[im 75/95  soft-tissue]
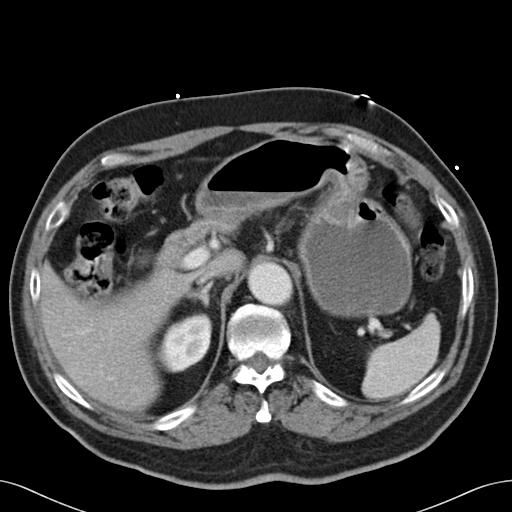
[im 75/95  lung]
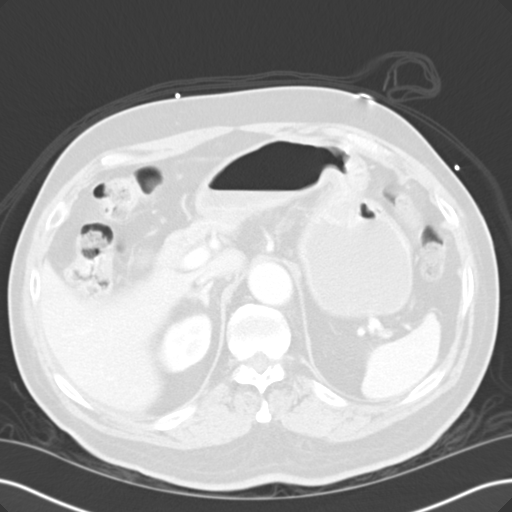
[im 80/95  soft-tissue]
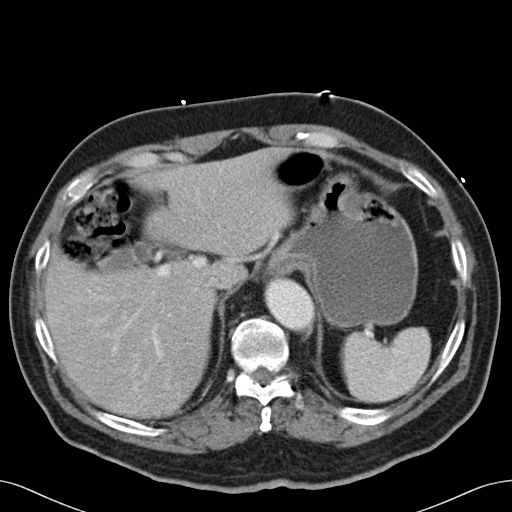
[im 80/95  lung]
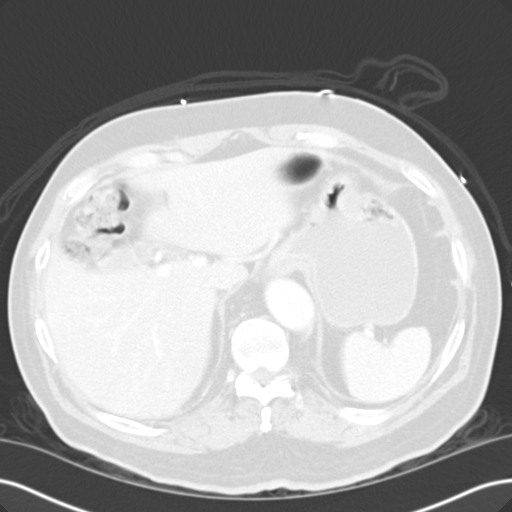
[im 85/95  lung]
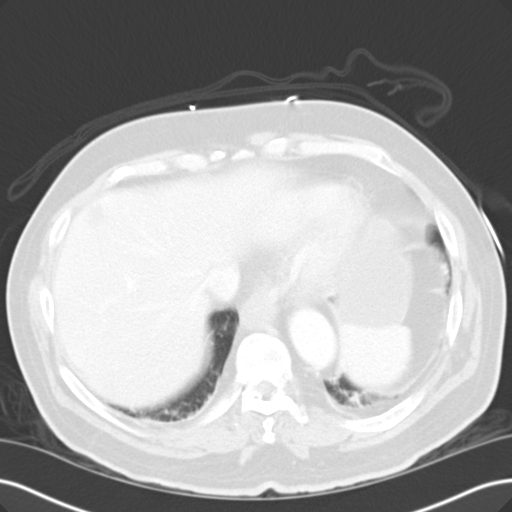
[im 90/95  soft-tissue]
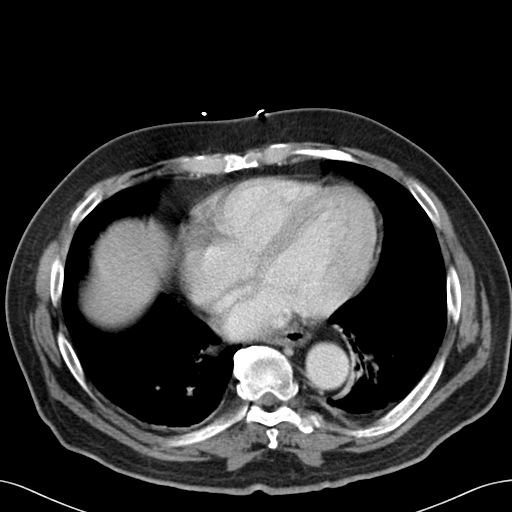
[im 90/95  lung]
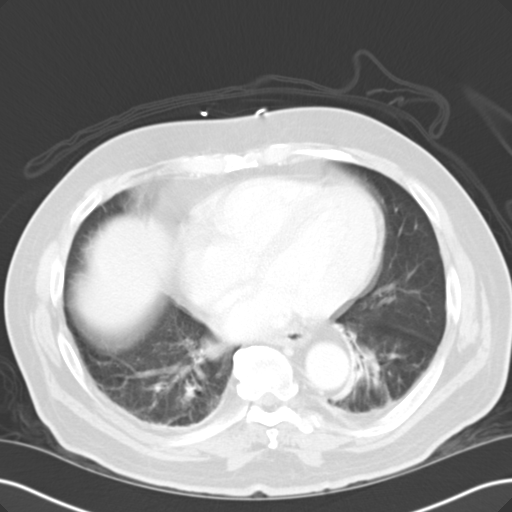

[13 of 32 positions shown; findings below may reference images not displayed]

FINDINGS: Lung bases are unremarkable. Sagittal images of the spine shows mild
degenerative changes lower thoracic spine. Enhanced liver is
unremarkable. There is no evidence of gastric outlet obstruction.
Enhanced pancreas, spleen and adrenal glands are unremarkable. No
aortic aneurysm. Enhanced kidneys are symmetrical in size. No
hydronephrosis or hydroureter. There is nonobstructive calculus
midpole of the left kidney measures 2.2 mm. There is a cyst in
midpole posterior aspect of the left kidney measures 1.4 cm.

Delayed renal images shows bilateral renal symmetrical excretion.
Bilateral visualized proximal ureter is unremarkable.

There are distended small bowel loops with fluid in right abdomen.
Fluid distended small bowel with and air-fluid levels are noted in
mid lower abdomen.

There is transition point in caliber of small bowel in axial image
42. Findings are consistent with small bowel obstruction. The
terminal ileum is small caliber decompressed. This is a high grade
obstruction.

In axial images 29 and 30 there is thickening of small bowel wall in
right abdomen up to 8 mm. Findings are highly suspicious for small
bowel edema or inflammation or small bowel wall. There is no
definite evidence of intramural gas to suggest acute small bowel
ischemia.

The right colon is decompressed. Moderate stool noted in transverse
colon. Some stool noted in sigmoid colon and rectum. Nonspecific
mild thickening of urinary bladder wall. Small right inguinal canal
hernia containing fat measures 1.5 cm without evidence of acute
complication. There is no adenopathy. No ascites or free air.

There is no pericecal inflammation. Normal appendix is noted in
axial image 48.

Prostate gland and seminal vesicles are unremarkable.

Mild degenerative changes bilateral hip joints with mild narrowing
of superior hip joint space.
IMPRESSION: 1. There are distended small bowel loops with air-fluid levels.
There is transition point in caliber of small bowel in axial image
42. Findings are consistent with high-grade small bowel obstruction.
2. There is segmental thickening of small bowel wall in right
abdomen please see axial image 29. Findings are consistent with
small bowel wall edema or inflammation. No definite evidence of
intramural gas to suggest acute small bowel ischemia. Clinical
correlation is necessary.
3. Normal appendix.  No pericecal inflammation.
4. Nonspecific mild thickening of urinary bladder wall.

## 2015-12-19 IMAGING — CR DG ABDOMEN 2V
1 series · 3 of 3 positions shown · non-contrast
Comparison: Abdominal radiographs and CT scan 10/12/2014

CLINICAL DATA: 68-year-old male with bowel obstruction and vomiting

EXAM:
ABDOMEN - 2 VIEW

[Series 1: dg abd 2 views · 0.14mm/px · 3 of 3 slices shown]
[im 1/3]
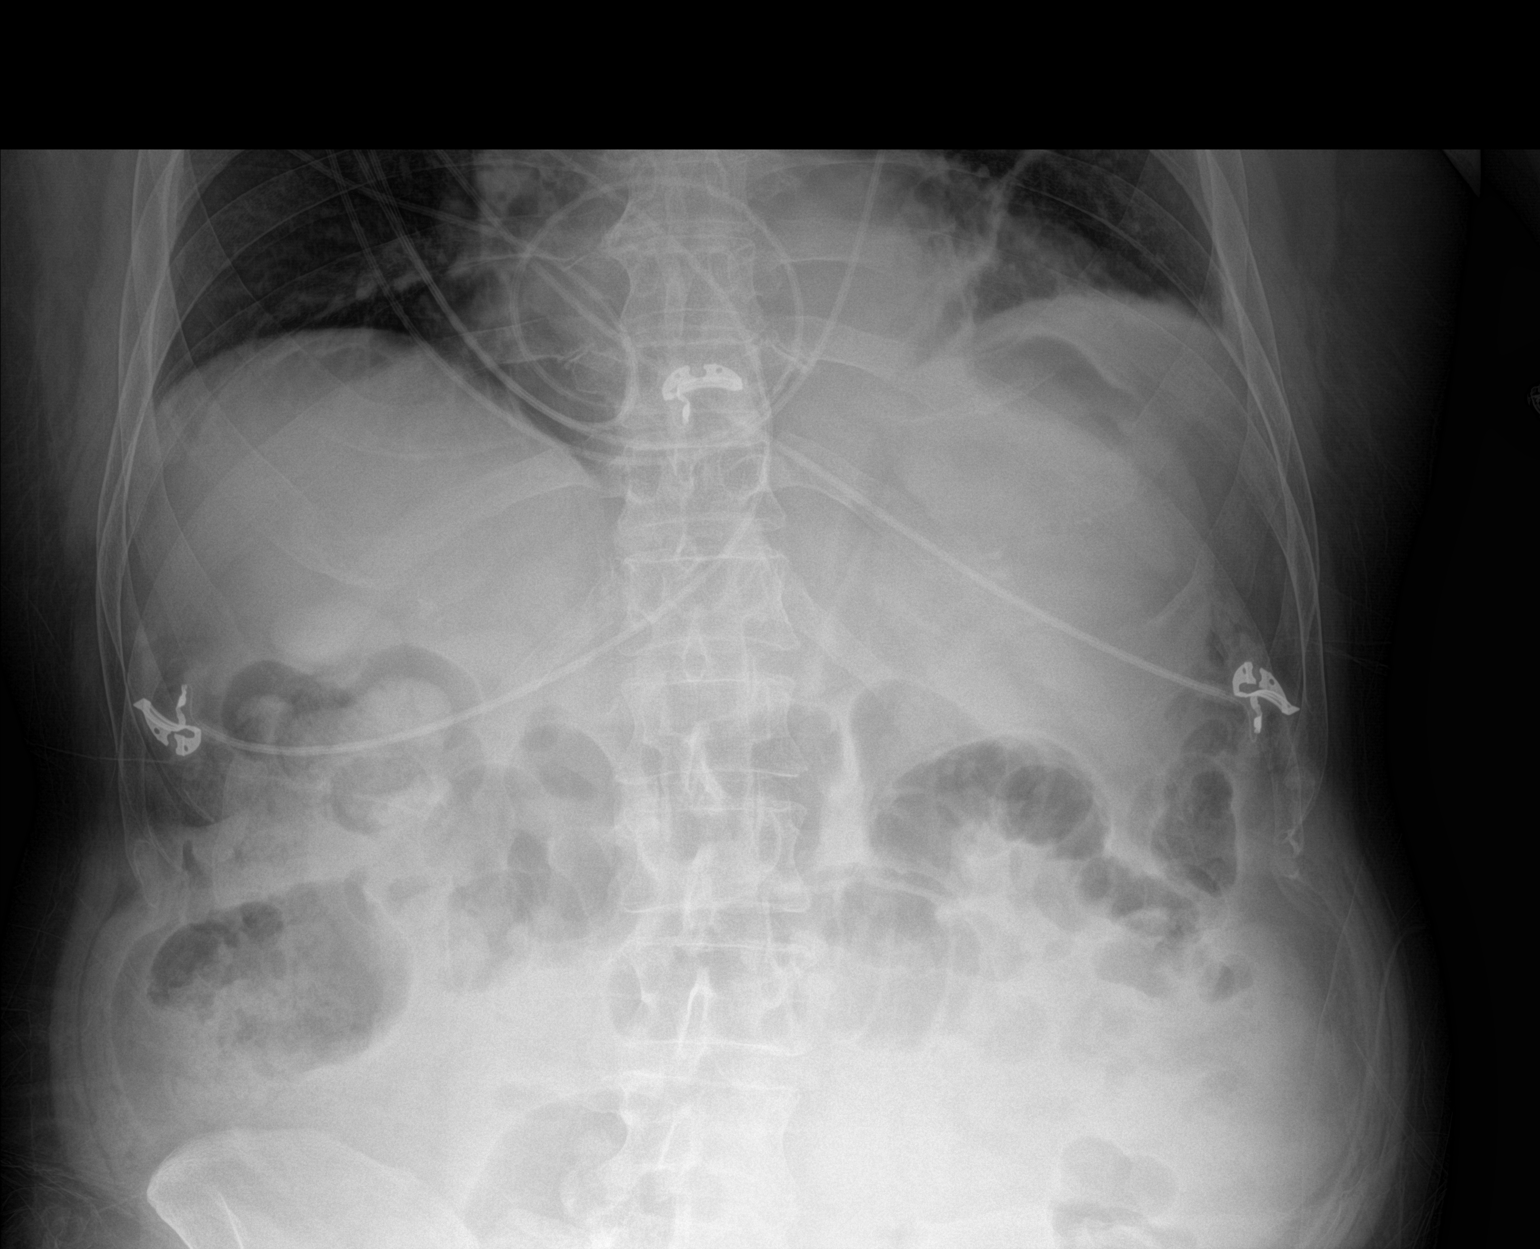
[im 2/3]
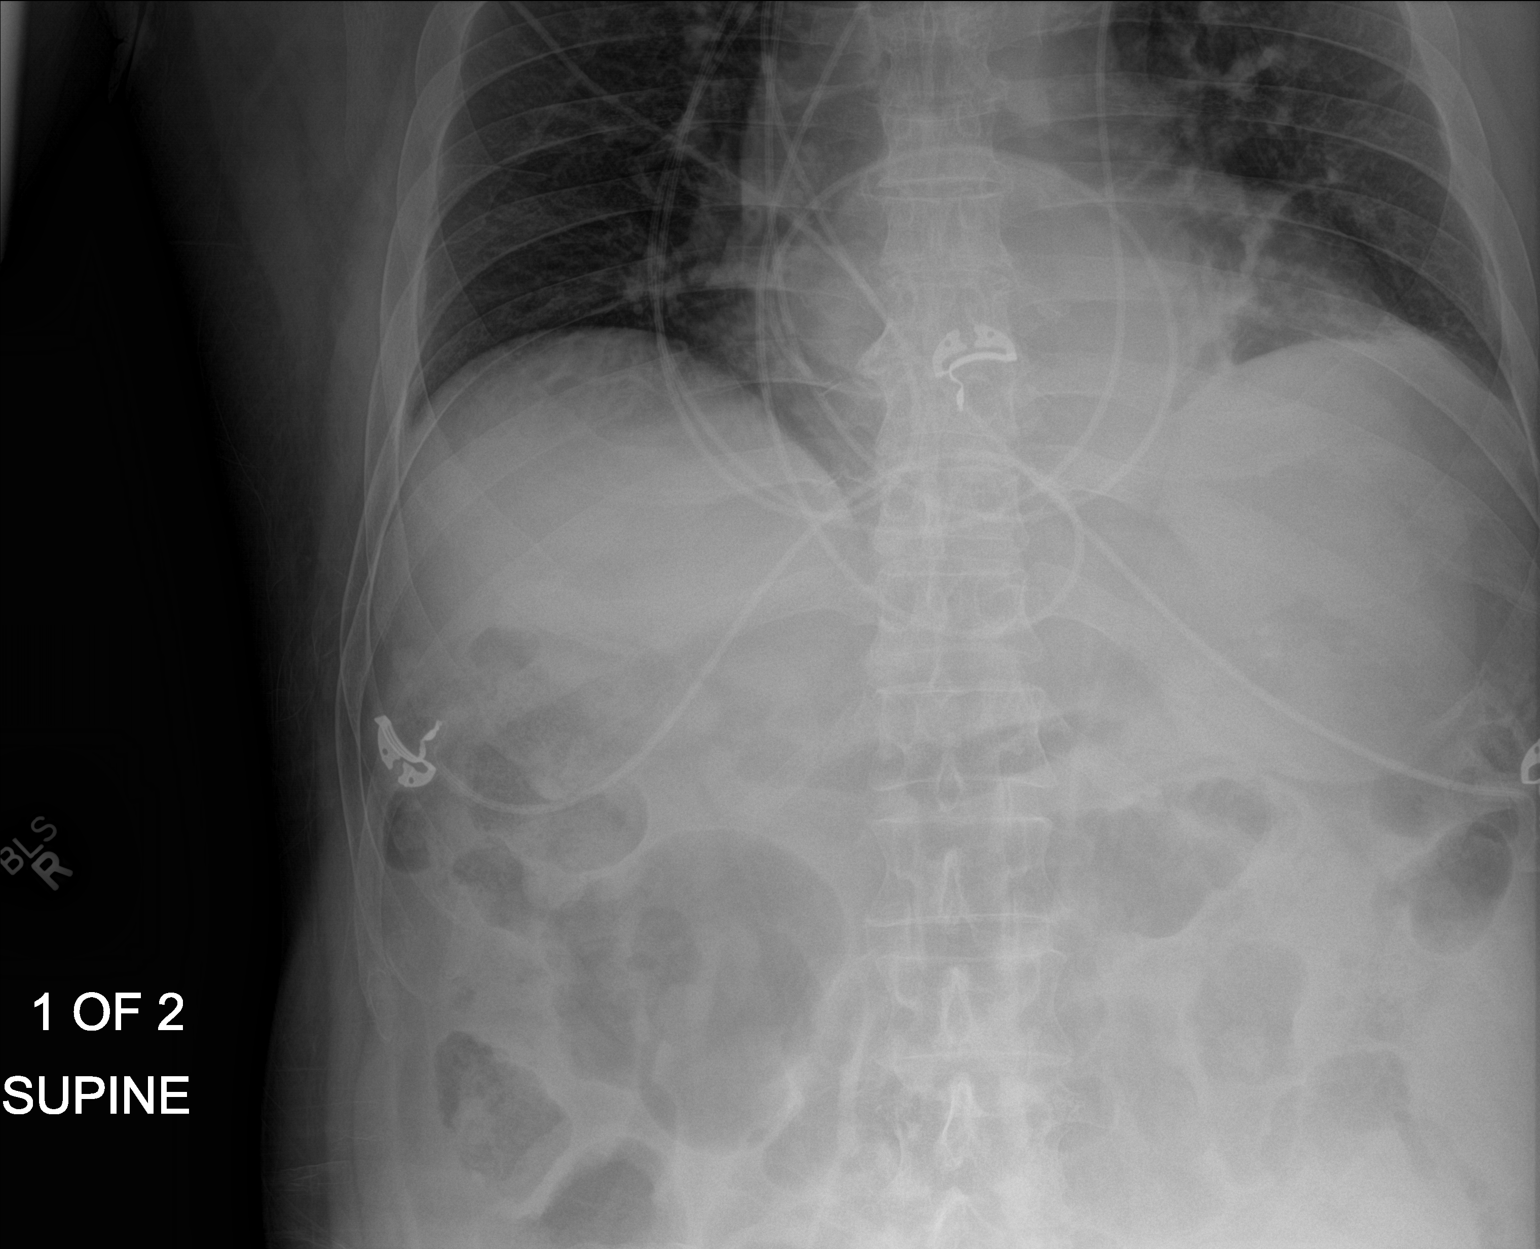
[im 3/3]
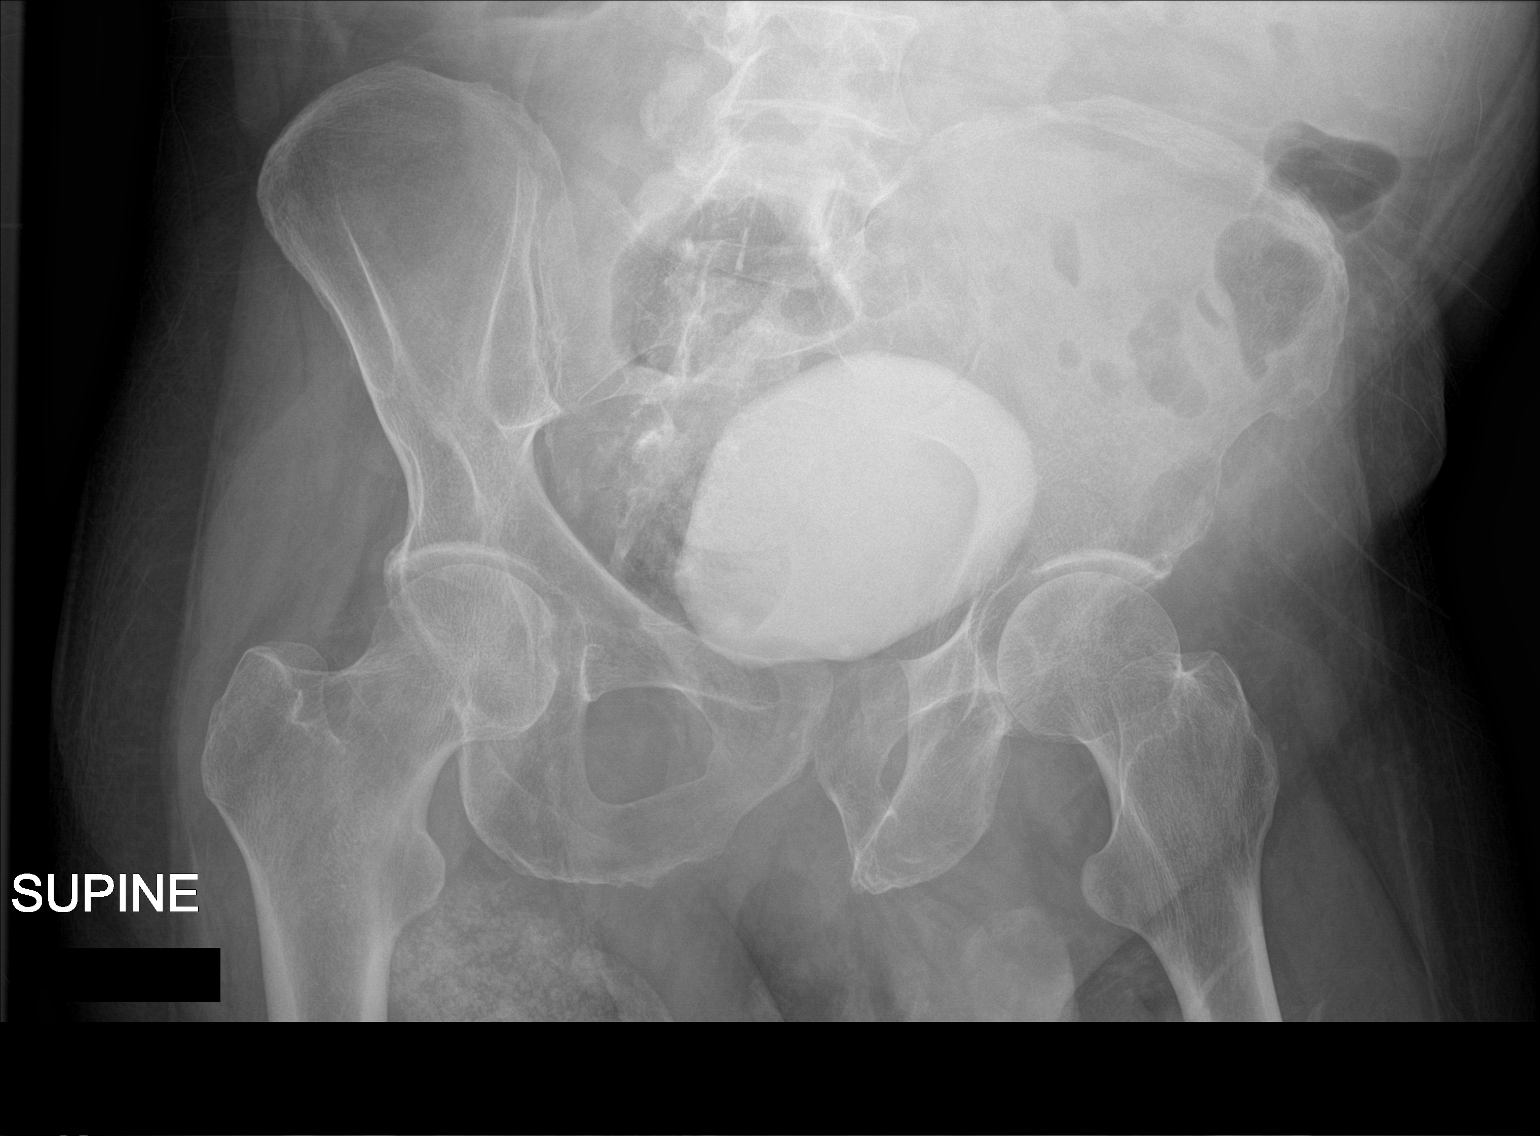

[3 of 3 positions shown; findings below may reference images not displayed]

FINDINGS: Increasing linear atelectasis in the left lower lobe. Slightly
improved gaseous distension of small bowel in the upper abdomen. No
free air on the upright view.
IMPRESSION: 1. Slightly improved gaseous distension of small bowel on the upper
abdomen.
2. Developing left lower lobe atelectasis.
